# Patient Record
Sex: Female | Born: 1970 | Race: Black or African American | Hispanic: No | Marital: Single | Smoking: Never smoker
Health system: Southern US, Community
[De-identification: ages and names within clinical notes are randomized; demographics above are authoritative.]

## PROBLEM LIST (undated history)

## (undated) DIAGNOSIS — M1990 Unspecified osteoarthritis, unspecified site: Secondary | ICD-10-CM

## (undated) DIAGNOSIS — J45909 Unspecified asthma, uncomplicated: Secondary | ICD-10-CM

## (undated) HISTORY — PX: COLONOSCOPY: SHX174

---

## 2016-07-04 ENCOUNTER — Other Ambulatory Visit: Payer: Self-pay | Admitting: Orthopedic Surgery

## 2016-07-04 DIAGNOSIS — M25562 Pain in left knee: Secondary | ICD-10-CM

## 2016-07-17 ENCOUNTER — Ambulatory Visit
Admission: RE | Admit: 2016-07-17 | Discharge: 2016-07-17 | Disposition: A | Discharge status: Home / Self Care | Payer: No Typology Code available for payment source | Source: Ambulatory Visit | Attending: Orthopedic Surgery | Admitting: Orthopedic Surgery

## 2016-07-17 DIAGNOSIS — M25562 Pain in left knee: Secondary | ICD-10-CM

## 2017-05-12 ENCOUNTER — Ambulatory Visit (INDEPENDENT_AMBULATORY_CARE_PROVIDER_SITE_OTHER): Payer: 59 | Admitting: Orthopedic Surgery

## 2017-05-12 ENCOUNTER — Encounter (INDEPENDENT_AMBULATORY_CARE_PROVIDER_SITE_OTHER): Payer: Self-pay | Admitting: Orthopedic Surgery

## 2017-05-12 DIAGNOSIS — G8929 Other chronic pain: Secondary | ICD-10-CM | POA: Diagnosis not present

## 2017-05-12 DIAGNOSIS — M25562 Pain in left knee: Secondary | ICD-10-CM | POA: Diagnosis not present

## 2017-05-15 NOTE — Progress Notes (Signed)
   Office Visit Note   Patient: Paige Cameron           Date of Birth: 16-Jul-1971           MRN: [id number] Visit Date: 05/12/2017 Requested by: No referring provider defined for this encounter. PCP: No primary care provider on file.  Subjective: Chief Complaint  Patient presents with  . Left Knee - Pain    HPI: Paige Cameron is a 46 year old patient with known left knee medial meniscal tear.  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  She states the knee feels heavy sometimes.  No family history of DVT.  She is taking ibuprofen for the symptoms.  She describes popping and locking in waking from the pain.  She states the knee is getting worse.              ROS: All systems reviewed are negative as they relate to the chief complaint within the history of present illness.  Patient denies  fevers or chills.   Assessment & Plan: Visit Diagnoses:  1. Chronic pain of left knee     Plan: Impression left knee symptomatic medial meniscal tear.  She spell nonoperative measures.  Plan is arthroscopy with meniscal debridement.  Risks and benefits discussed with the patient could not limited to infection or vessel damage incomplete pain relief as well as stiffness.  Time out of work discussed.  All questions answered.  Follow-Up Instructions: No Follow-up on file.   Orders:  No orders of the defined types were placed in this encounter.  No orders of the defined types were placed in this encounter.     Procedures: No procedures performed   Clinical Data: No additional findings.  Objective: Vital Signs: There were no vitals taken for this visit.  Physical Exam:   Constitutional: Patient appears well-developed HEENT:  Head: Normocephalic Eyes:EOM are normal Neck: Normal range of motion Cardiovascular: Normal rate Pulmonary/chest: Effort normal Neurologic: Patient is alert Skin: Skin is warm Psychiatric: Patient has normal mood and affect    Ortho Exam:  Orthopedic exam demonstrates full active and passive range of motion of the left knee with medial joint line tenderness positive direct compression testing stable collateral crucial ligaments intact since mechanism no other masses lymph and after skin changes noted in the left knee region.  Range of motion is full.  Extensor mechanism is intact.  Specialty Comments:  No specialty comments available.  Imaging: No results found.   PMFS History: There are no active problems to display for this patient.  No past medical history on file.  No family history on file.  No past surgical history on file. Social History   Occupational History  . Not on file.   Social History Main Topics  . Smoking status: Never Smoker  . Smokeless tobacco: Never Used  . Alcohol use Not on file  . Drug use: Unknown  . Sexual activity: Not on file

## 2017-06-02 ENCOUNTER — Encounter (INDEPENDENT_AMBULATORY_CARE_PROVIDER_SITE_OTHER): Payer: Self-pay | Admitting: Orthopedic Surgery

## 2017-06-21 ENCOUNTER — Inpatient Hospital Stay (INDEPENDENT_AMBULATORY_CARE_PROVIDER_SITE_OTHER): Payer: 59 | Admitting: Orthopedic Surgery

## 2017-06-21 ENCOUNTER — Encounter (INDEPENDENT_AMBULATORY_CARE_PROVIDER_SITE_OTHER): Payer: Self-pay | Admitting: Orthopedic Surgery

## 2017-06-21 DIAGNOSIS — S83242D Other tear of medial meniscus, current injury, left knee, subsequent encounter: Secondary | ICD-10-CM | POA: Diagnosis not present

## 2017-06-28 ENCOUNTER — Ambulatory Visit (INDEPENDENT_AMBULATORY_CARE_PROVIDER_SITE_OTHER): Payer: 59 | Admitting: Orthopedic Surgery

## 2017-06-28 ENCOUNTER — Encounter (INDEPENDENT_AMBULATORY_CARE_PROVIDER_SITE_OTHER): Payer: Self-pay | Admitting: Orthopedic Surgery

## 2017-06-28 DIAGNOSIS — S838X2D Sprain of other specified parts of left knee, subsequent encounter: Secondary | ICD-10-CM

## 2017-06-28 NOTE — Progress Notes (Signed)
   Post-Op Visit Note   Patient: Alison StallingJosette Alcock           Date of Birth: 29-Jul-1971           MRN: [id number] Visit Date: 06/28/2017 PCP: Patient, No Pcp Per   Assessment & Plan:  Chief Complaint:  Chief Complaint  Patient presents with  . Left Knee - Routine Post Op   Visit Diagnoses: No diagnosis found.  Plan: [name] is a 46 year old patient who is now a week out knee arthroscopy for partial medial meniscal tear.  On exam she has mild effusions in his mechanism is intact she is About 70 of flexion.  Plan is for her to really continue to work on flexion as well as quad strengthening exercises.  Sr. a bike use and encouraged.  Need to see her back in 2 weeks to make sure she is making progress with flexion.  A consider aspiration of the knee at that time but that's an issue.  Also want her to take an anti-inflammatory to try to decrease the swelling in order to allow her to increase her motion.  Follow-Up Instructions: Return in about 2 weeks (around 07/12/2017).   Orders:  No orders of the defined types were placed in this encounter.  No orders of the defined types were placed in this encounter.   Imaging: No results found.  PMFS History: There are no active problems to display for this patient.  No past medical history on file.  No family history on file.  No past surgical history on file. Social History   Occupational History  . Not on file.   Social History Main Topics  . Smoking status: Never Smoker  . Smokeless tobacco: Never Used  . Alcohol use Not on file  . Drug use: Unknown  . Sexual activity: Not on file

## 2017-07-12 ENCOUNTER — Encounter (INDEPENDENT_AMBULATORY_CARE_PROVIDER_SITE_OTHER): Payer: Self-pay | Admitting: Orthopedic Surgery

## 2017-07-12 ENCOUNTER — Ambulatory Visit (INDEPENDENT_AMBULATORY_CARE_PROVIDER_SITE_OTHER): Payer: 59 | Admitting: Orthopedic Surgery

## 2017-07-12 DIAGNOSIS — S838X2D Sprain of other specified parts of left knee, subsequent encounter: Secondary | ICD-10-CM

## 2017-07-12 NOTE — Progress Notes (Signed)
   Post-Op Visit Note   Patient: Paige Cameron           Date of Birth: 1971-04-27           MRN: [id number] Visit Date: 07/12/2017 PCP: Patient, No Pcp Per   Assessment & Plan:  Chief Complaint:  Chief Complaint  Patient presents with  . Left Knee - Routine Post Op   Visit Diagnoses: No diagnosis found.  Plan: Patient is 3 weeks out left knee arthroscopy posterior medial meniscectomy.  Back today to make sure flexion was improving.  She has a mild effusion and flexion past 90.  Continue with nonweightbearing quad strengthening exercises.  Ibuprofen 1-2 a day would help to keep the swelling down.  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  Follow-Up Instructions: Return in about 3 weeks (around 08/02/2017).   Orders:  No orders of the defined types were placed in this encounter.  No orders of the defined types were placed in this encounter.   Imaging: No results found.  PMFS History: There are no active problems to display for this patient.  No past medical history on file.  No family history on file.  No past surgical history on file. Social History   Occupational History  . Not on file.   Social History Main Topics  . Smoking status: Never Smoker  . Smokeless tobacco: Never Used  . Alcohol use Not on file  . Drug use: Unknown  . Sexual activity: Not on file

## 2017-08-04 ENCOUNTER — Encounter (INDEPENDENT_AMBULATORY_CARE_PROVIDER_SITE_OTHER): Payer: Self-pay | Admitting: Orthopedic Surgery

## 2017-08-04 ENCOUNTER — Ambulatory Visit (INDEPENDENT_AMBULATORY_CARE_PROVIDER_SITE_OTHER): Payer: 59 | Admitting: Orthopedic Surgery

## 2017-08-04 DIAGNOSIS — S838X2D Sprain of other specified parts of left knee, subsequent encounter: Secondary | ICD-10-CM

## 2017-08-05 NOTE — Progress Notes (Signed)
   Post-Op Visit Note   Patient: Paige Cameron           Date of Birth: February 13, 1971           MRN: [id number] Visit Date: 08/04/2017 PCP: Patient, No Pcp Per   Assessment & Plan:  Chief Complaint:  Chief Complaint  Patient presents with  . Left Knee - Follow-up   Visit Diagnoses: No diagnosis found.  Plan: Paige Cameron is a patient is now 6 weeks out left knee posterior medial meniscectomy.  She's been doing well.'s range of motion has improved and is normalized.  On exam there is only trace effusion and no joint line tenderness.  Quad strength is improving.  Plan is for her to return to work.  She works in Office managersecurity.  There is not too much walking involved.  Nonweightbearing quad strengthening exercises encouraged.  Follow-up with me as needed  Follow-Up Instructions: Return if symptoms worsen or fail to improve.   Orders:  No orders of the defined types were placed in this encounter.  No orders of the defined types were placed in this encounter.   Imaging: No results found.  PMFS History: There are no active problems to display for this patient.  No past medical history on file.  No family history on file.  No past surgical history on file. Social History   Occupational History  . Not on file.   Social History Main Topics  . Smoking status: Never Smoker  . Smokeless tobacco: Never Used  . Alcohol use Not on file  . Drug use: Unknown  . Sexual activity: Not on file

## 2017-09-09 ENCOUNTER — Ambulatory Visit (INDEPENDENT_AMBULATORY_CARE_PROVIDER_SITE_OTHER): Payer: 59 | Admitting: Orthopedic Surgery

## 2017-09-09 ENCOUNTER — Encounter (INDEPENDENT_AMBULATORY_CARE_PROVIDER_SITE_OTHER): Payer: Self-pay | Admitting: Orthopedic Surgery

## 2017-09-09 DIAGNOSIS — S838X2D Sprain of other specified parts of left knee, subsequent encounter: Secondary | ICD-10-CM

## 2017-09-11 NOTE — Progress Notes (Signed)
   Post-Op Visit Note   Patient: Paige Cameron           Date of Birth: 1971-09-19           MRN: [id number] Visit Date: 09/09/2017 PCP: Patient, No Pcp Per   Assessment & Plan:  Chief Complaint:  Chief Complaint  Patient presents with  . Left Knee - Pain   Visit Diagnoses: No diagnosis found.  Plan: Paige Cameron is a 46 year old patient who is now almost 3 months out left knee arthroscopy.  The having some recurrent pain for the past 2 and half weeks.  She had partial medial meniscectomy.  On examination she does have an effusion.  That effusion is aspirated today and we injected 9 mL of Marcaine was cc Depo-Medrol.  Aspirated about 40 mL of yellow fluid.  Would have her continue taking ibuprofen.  6 week return for clinical recheck at that time  Follow-Up Instructions: Return if symptoms worsen or fail to improve.   Orders:  No orders of the defined types were placed in this encounter.  No orders of the defined types were placed in this encounter.   Imaging: No results found.  PMFS History: There are no active problems to display for this patient.  No past medical history on file.  No family history on file.  No past surgical history on file. Social History   Occupational History  . Not on file.   Social History Main Topics  . Smoking status: Never Smoker  . Smokeless tobacco: Never Used  . Alcohol use Not on file  . Drug use: Unknown  . Sexual activity: Not on file

## 2017-10-25 ENCOUNTER — Encounter (INDEPENDENT_AMBULATORY_CARE_PROVIDER_SITE_OTHER): Payer: Self-pay | Admitting: Orthopedic Surgery

## 2017-10-25 ENCOUNTER — Ambulatory Visit (INDEPENDENT_AMBULATORY_CARE_PROVIDER_SITE_OTHER): Payer: 59 | Admitting: Orthopedic Surgery

## 2017-10-25 DIAGNOSIS — S838X2D Sprain of other specified parts of left knee, subsequent encounter: Secondary | ICD-10-CM | POA: Diagnosis not present

## 2017-10-25 NOTE — Progress Notes (Signed)
   Office Visit Note   Patient: Paige Cameron           Date of Birth: 10/09/1971           MRN: [id number] Visit Date: 10/25/2017 Requested by: No referring provider defined for this encounter. PCP: Patient, No Pcp Per  Subjective: Chief Complaint  Patient presents with  . Left Knee - Follow-up    HPI: Visit is a 46 year old patient with left knee pain.  She had arthroscopy and partial meniscectomy about 4 months ago.  She's doing light duty at work.  Taking ibuprofen occasionally.  No mechanical symptoms but does get some swelling periodically.              ROS: All systems reviewed are negative as they relate to the chief complaint within the history of present illness.  Patient denies  fevers or chills.   Assessment & Plan: Visit Diagnoses:  1. Injury of meniscus of left knee, subsequent encounter     Plan: Impression is recurrent pain and mild swelling in the left knee.  I think this is related to her surgery and subsequent cushion deficiency in the knee.  Had a seasonal be okay for regular duty in 2 months.  I'll see her back in 4 months for clinical recheck.  Continue with anti-inflammatories as needed for pain  Follow-Up Instructions: Return in about 4 months (around 02/22/2018).   Orders:  No orders of the defined types were placed in this encounter.  No orders of the defined types were placed in this encounter.     Procedures: No procedures performed   Clinical Data: No additional findings.  Objective: Vital Signs: There were no vitals taken for this visit.  Physical Exam:   Constitutional: Patient appears well-developed HEENT:  Head: Normocephalic Eyes:EOM are normal Neck: Normal range of motion Cardiovascular: Normal rate Pulmonary/chest: Effort normal Neurologic: Patient is alert Skin: Skin is warm Psychiatric: Patient has normal mood and affect    Ortho Exam: Orthopedic exam demonstrates full range of motion of the left knee but with mild  effusion present.  No focal joint line tenderness is present.  Pedal pulses palpable no calf tenderness noted.  No groin pain with internal/external rotation of the leg.  No other masses lymph adenopathy or skin changes noted in the left knee region  Specialty Comments:  No specialty comments available.  Imaging: No results found.   PMFS History: There are no active problems to display for this patient.  History reviewed. No pertinent past medical history.  History reviewed. No pertinent family history.  History reviewed. No pertinent surgical history. Social History   Occupational History  . Not on file  Tobacco Use  . Smoking status: Never Smoker  . Smokeless tobacco: Never Used  Substance and Sexual Activity  . Alcohol use: Not on file  . Drug use: Not on file  . Sexual activity: Not on file

## 2018-02-23 ENCOUNTER — Ambulatory Visit (INDEPENDENT_AMBULATORY_CARE_PROVIDER_SITE_OTHER): Payer: 59 | Admitting: Orthopedic Surgery

## 2018-02-23 ENCOUNTER — Encounter (INDEPENDENT_AMBULATORY_CARE_PROVIDER_SITE_OTHER): Payer: Self-pay | Admitting: Orthopedic Surgery

## 2018-02-23 DIAGNOSIS — M25562 Pain in left knee: Secondary | ICD-10-CM | POA: Diagnosis not present

## 2018-02-27 ENCOUNTER — Encounter (INDEPENDENT_AMBULATORY_CARE_PROVIDER_SITE_OTHER): Payer: Self-pay | Admitting: Orthopedic Surgery

## 2018-02-27 NOTE — Progress Notes (Signed)
   Office Visit Note   Patient: Paige StallingJosette Rutan           Date of Birth: 04-Feb-1971           MRN: [id number] Visit Date: 02/23/2018 Requested by: No referring provider defined for this encounter. PCP: Patient, No Pcp Per  Subjective: Chief Complaint  Patient presents with  . Left Knee - Pain    HPI: Present is a patient with left knee pain.  She had left knee arthroscopy in July 2018.  Reports continued pain and swelling along with weakness and giving way.  Takes ibuprofen as needed.  She had partial lateral meniscectomy at that time.  She is having now medial sided pain in that left knee.  She does sedentary type work.              ROS: All systems reviewed are negative as they relate to the chief complaint within the history of present illness.  Patient denies  fevers or chills.   Assessment & Plan: Visit Diagnoses:  1. Left knee pain, unspecified chronicity     Plan: Impression is left knee pain.  Having some medial sided pain with recurrent effusion.  May or may not represent new pathology.  She has had multiple aspirations and injections.  She is failed conservative management.  Plan is for MRI scanning of that left knee with return office visit after that.  Follow-Up Instructions: Return for after MRI.   Orders:  Orders Placed This Encounter  Procedures  . MR Knee Left w/o contrast   No orders of the defined types were placed in this encounter.     Procedures: No procedures performed   Clinical Data: No additional findings.  Objective: Vital Signs: There were no vitals taken for this visit.  Physical Exam:   Constitutional: Patient appears well-developed HEENT:  Head: Normocephalic Eyes:EOM are normal Neck: Normal range of motion Cardiovascular: Normal rate Pulmonary/chest: Effort normal Neurologic: Patient is alert Skin: Skin is warm Psychiatric: Patient has normal mood and affect    Ortho Exam: Orthopedic exam demonstrates normal gait and  alignment.  Mild effusion in the left knee no effusion in the right knee.  Collateral cruciate ligaments are stable.  No masses lymphadenopathy or skin changes noted in that left knee region.  Mechanism is intact.  Both medial and lateral joint line tenderness is present  Specialty Comments:  No specialty comments available.  Imaging: No results found.   PMFS History: There are no active problems to display for this patient.  History reviewed. No pertinent past medical history.  History reviewed. No pertinent family history.  History reviewed. No pertinent surgical history. Social History   Occupational History  . Not on file  Tobacco Use  . Smoking status: Never Smoker  . Smokeless tobacco: Never Used  Substance and Sexual Activity  . Alcohol use: Not on file  . Drug use: Not on file  . Sexual activity: Not on file

## 2018-03-09 ENCOUNTER — Ambulatory Visit
Admission: RE | Admit: 2018-03-09 | Discharge: 2018-03-09 | Disposition: A | Discharge status: Home / Self Care | Payer: 59 | Source: Ambulatory Visit | Attending: Orthopedic Surgery | Admitting: Orthopedic Surgery

## 2018-03-09 DIAGNOSIS — M25562 Pain in left knee: Secondary | ICD-10-CM

## 2018-03-28 ENCOUNTER — Ambulatory Visit (INDEPENDENT_AMBULATORY_CARE_PROVIDER_SITE_OTHER): Payer: 59 | Admitting: Orthopedic Surgery

## 2018-04-07 ENCOUNTER — Ambulatory Visit (INDEPENDENT_AMBULATORY_CARE_PROVIDER_SITE_OTHER): Payer: 59 | Admitting: Orthopedic Surgery

## 2018-04-07 ENCOUNTER — Encounter (INDEPENDENT_AMBULATORY_CARE_PROVIDER_SITE_OTHER): Payer: Self-pay | Admitting: Orthopedic Surgery

## 2018-04-07 DIAGNOSIS — S838X2D Sprain of other specified parts of left knee, subsequent encounter: Secondary | ICD-10-CM

## 2018-04-09 ENCOUNTER — Encounter (INDEPENDENT_AMBULATORY_CARE_PROVIDER_SITE_OTHER): Payer: Self-pay | Admitting: Orthopedic Surgery

## 2018-04-09 DIAGNOSIS — S838X2D Sprain of other specified parts of left knee, subsequent encounter: Secondary | ICD-10-CM | POA: Diagnosis not present

## 2018-04-09 MED ORDER — LIDOCAINE HCL 1 % IJ SOLN
5.0000 mL | INTRAMUSCULAR | Status: AC | PRN
  Administered 2018-04-09: 5 mL

## 2018-04-09 MED ORDER — METHYLPREDNISOLONE ACETATE 40 MG/ML IJ SUSP
40.0000 mg | INTRAMUSCULAR | Status: AC | PRN
  Administered 2018-04-09: 40 mg via INTRA_ARTICULAR

## 2018-04-09 MED ORDER — BUPIVACAINE HCL 0.25 % IJ SOLN
4.0000 mL | INTRAMUSCULAR | Status: AC | PRN
  Administered 2018-04-09: 4 mL via INTRA_ARTICULAR

## 2018-04-09 NOTE — Progress Notes (Signed)
Office Visit Note   Patient: Paige Cameron           Date of Birth: 01-09-1971           MRN: [id number] Visit Date: 04/07/2018 Requested by: No referring provider defined for this encounter. PCP: Patient, No Pcp Per  Subjective: Chief Complaint  Patient presents with  . Left Knee - Follow-up    HPI: Shows that is a patient with left knee pain.  Since I have seen her she had an MRI scan.  That scan is reviewed with the patient today.  It does show interval increase in moderate arthritis in that medial compartment along with a recurrent left knee medial meniscal tear.  She is fairly symptomatic from this but does not really want to pursue any intervention at this time.  Discussed with her at length how operative intervention would likely give her short-term relief but the arthritis in that medial compartment will likely progress as it has been doing.  She is taking anti-inflammatories for the problem.  She may get an over-the-counter knee sleeve.              ROS: All systems reviewed are negative as they relate to the chief complaint within the history of present illness.  Patient denies  fevers or chills.   Assessment & Plan: Visit Diagnoses:  1. Injury of meniscus of left knee, subsequent encounter     Plan: Impression is left knee pain with recurrent meniscal tear and progressive arthritis in the medial compartment.  Aspiration injection of that knee is performed today.  79-month recheck to make the decision for or against arthroscopic intervention.  Again I think removing more that meniscus will likely help her short-term but her knee arthritis is likely to be progressive based on the load that knee is bearing along with absence of that cushioning effect of the meniscus  Follow-Up Instructions: Return in about 3 months (around 07/08/2018).   Orders:  No orders of the defined types were placed in this encounter.  No orders of the defined types were placed in this  encounter.     Procedures: Large Joint Inj: L knee on 04/09/2018 7:46 PM Indications: diagnostic evaluation, joint swelling and pain Details: 18 G 1.5 in needle, superolateral approach  Arthrogram: No  Medications: 5 mL lidocaine 1 %; 40 mg methylPREDNISolone acetate 40 MG/ML; 4 mL bupivacaine 0.25 % Aspirate: 15 mL yellow Outcome: tolerated well, no immediate complications Procedure, treatment alternatives, risks and benefits explained, specific risks discussed. Consent was given by the patient. Immediately prior to procedure a time out was called to verify the correct patient, procedure, equipment, support staff and site/side marked as required. Patient was prepped and draped in the usual sterile fashion.       Clinical Data: No additional findings.  Objective: Vital Signs: There were no vitals taken for this visit.  Physical Exam:   Constitutional: Patient appears well-developed HEENT:  Head: Normocephalic Eyes:EOM are normal Neck: Normal range of motion Cardiovascular: Normal rate Pulmonary/chest: Effort normal Neurologic: Patient is alert Skin: Skin is warm Psychiatric: Patient has normal mood and affect    Ortho Exam: Orthopedic exam demonstrates full active and passive range of motion of the right knee.  Left knee has mild effusion and medial joint line tenderness but good range of motion and intact extensor mechanism.  No other masses lymph adenopathy or skin changes noted in that left knee region.  Pedal pulses palpable.  Specialty Comments:  No specialty  comments available.  Imaging: No results found.   PMFS History: There are no active problems to display for this patient.  History reviewed. No pertinent past medical history.  History reviewed. No pertinent family history.  History reviewed. No pertinent surgical history. Social History   Occupational History  . Not on file  Tobacco Use  . Smoking status: Never Smoker  . Smokeless tobacco: Never  Used  Substance and Sexual Activity  . Alcohol use: Not on file  . Drug use: Not on file  . Sexual activity: Not on file

## 2018-07-11 ENCOUNTER — Ambulatory Visit (INDEPENDENT_AMBULATORY_CARE_PROVIDER_SITE_OTHER): Payer: 59 | Admitting: Orthopedic Surgery

## 2018-07-20 ENCOUNTER — Ambulatory Visit (INDEPENDENT_AMBULATORY_CARE_PROVIDER_SITE_OTHER): Payer: 59 | Admitting: Orthopedic Surgery

## 2018-11-15 IMAGING — MR MR KNEE*L* W/O CM
4 of 6 series · 18 of 40 positions shown · non-contrast
Comparison: MRI left knee 07/17/2016.

CLINICAL DATA: The patient underwent left knee surgery for a torn
meniscus 05/29/2017. Recurrent left knee pain on the medial side
with popping and weakness.

EXAM:
MRI OF THE LEFT KNEE WITHOUT CONTRAST
TECHNIQUE: Multiplanar, multisequence MR imaging of the knee was performed. No
intravenous contrast was administered.

[Series 3: PD fat-sat · axial · 4.0mm · 0.31mm/px · z∈[-49,+74]mm · 8 of 29 slices shown (1 of 4)]
[im 1/29]
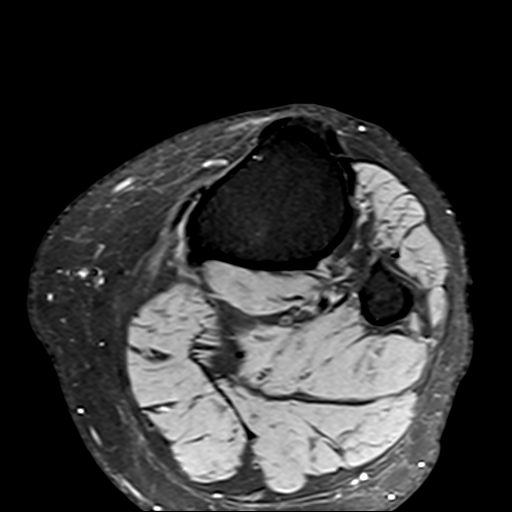
[im 5/29]
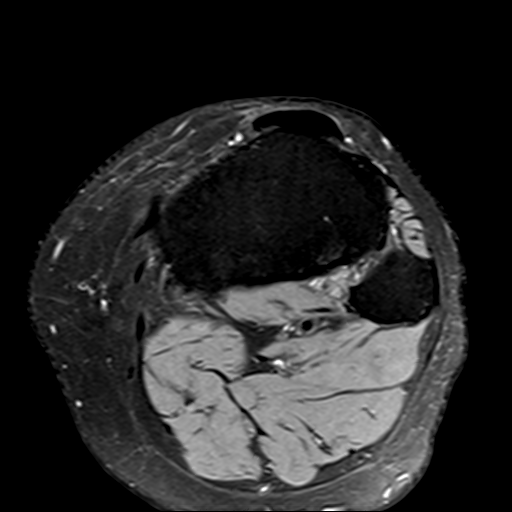
[im 9/29]
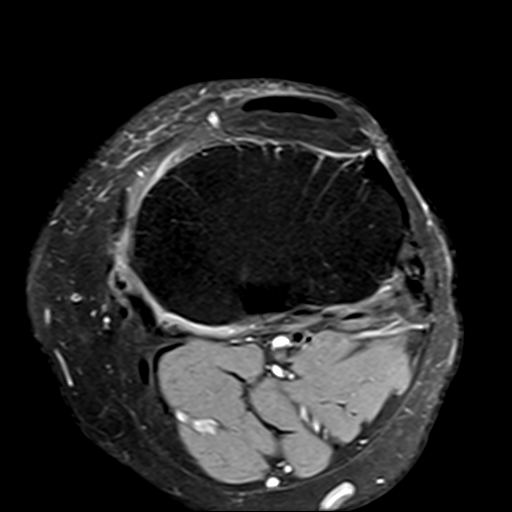
[im 13/29]
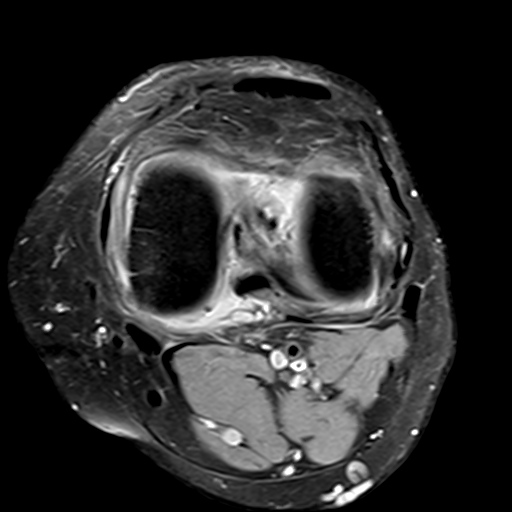
[im 17/29]
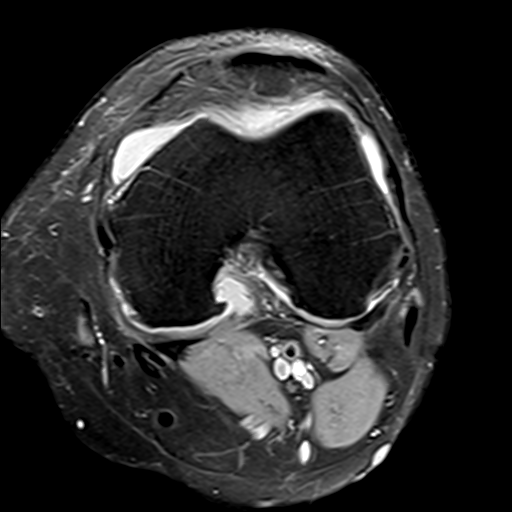
[im 21/29]
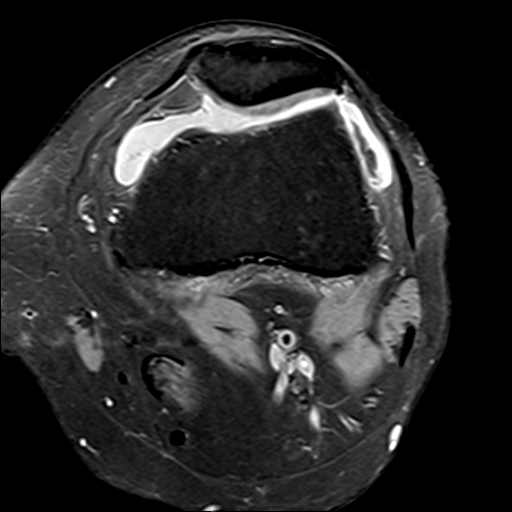
[im 25/29]
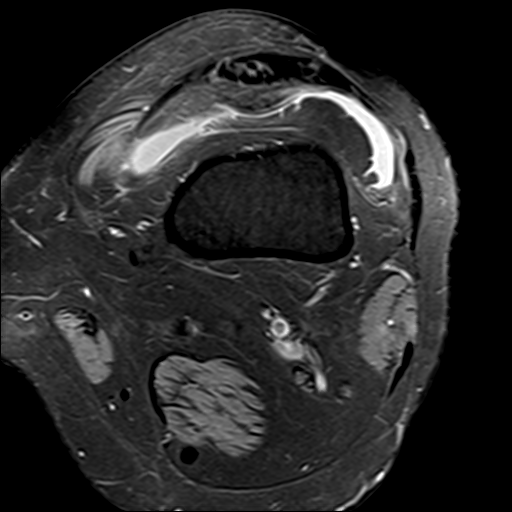
[im 29/29]
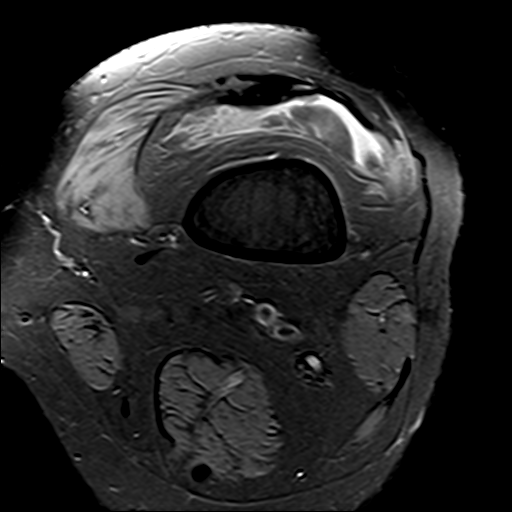

[Series 6: PD fat-sat · coronal · 3.5mm · 0.31mm/px · 4 of 30 slices shown (2 of 4)]
[im 1/30]
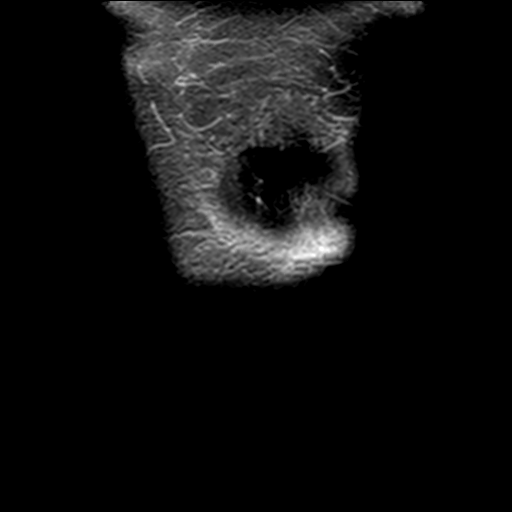
[im 5/30]
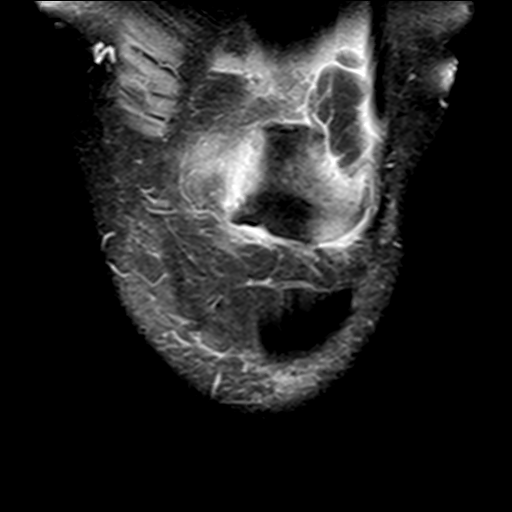
[im 15/30]
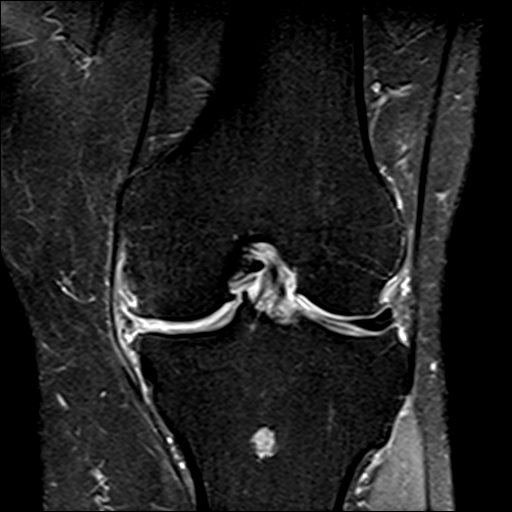
[im 25/30]
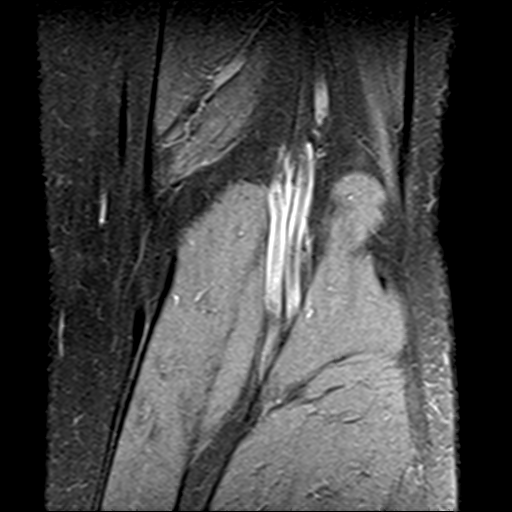

[Series 7: PD fat-sat · sagittal · 3.5mm · 0.31mm/px · 3 of 28 slices shown (3 of 4)]
[im 5/28]
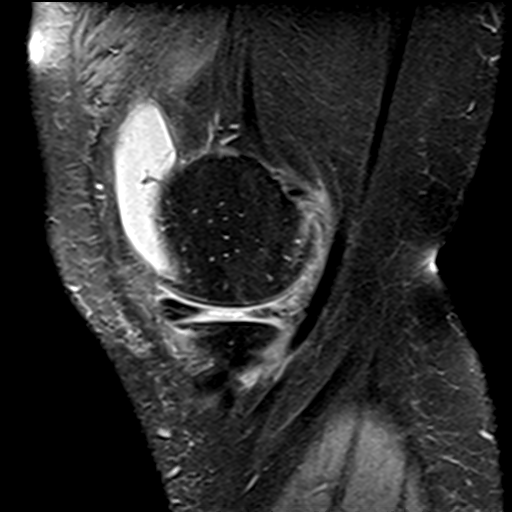
[im 14/28]
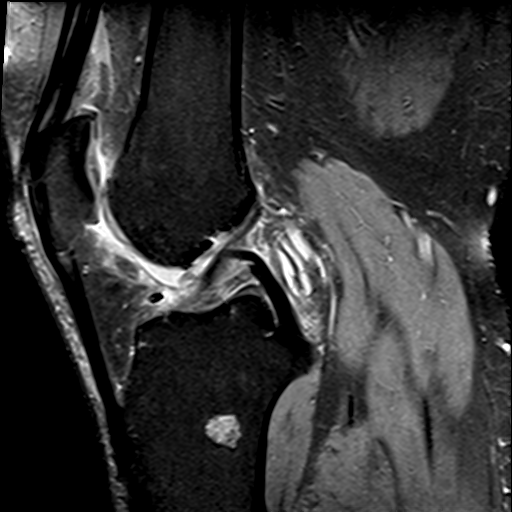
[im 23/28]
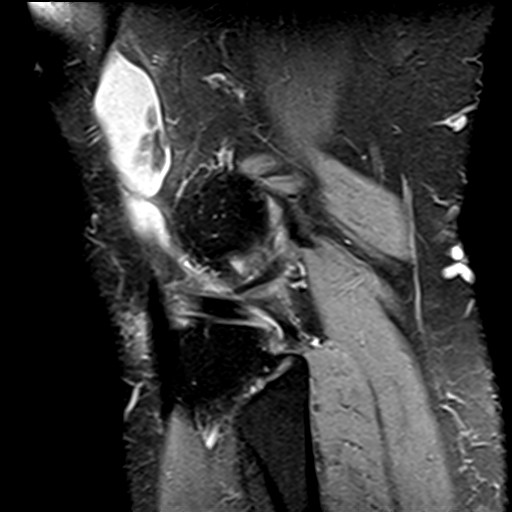

[Series 8: PD fat-sat · coronal · 2.0mm · 0.31mm/px · 3 of 14 slices shown (4 of 4)]
[im 1/14]
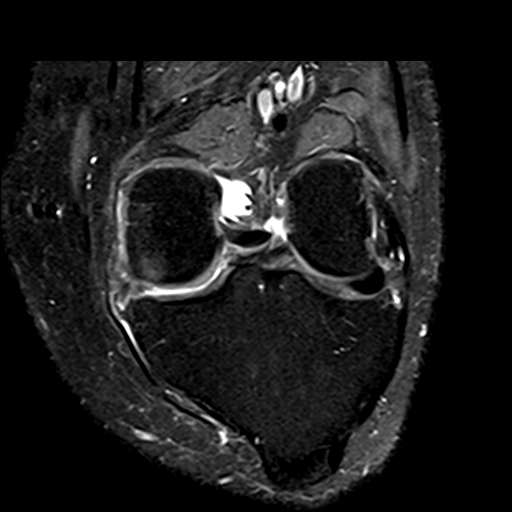
[im 7/14]
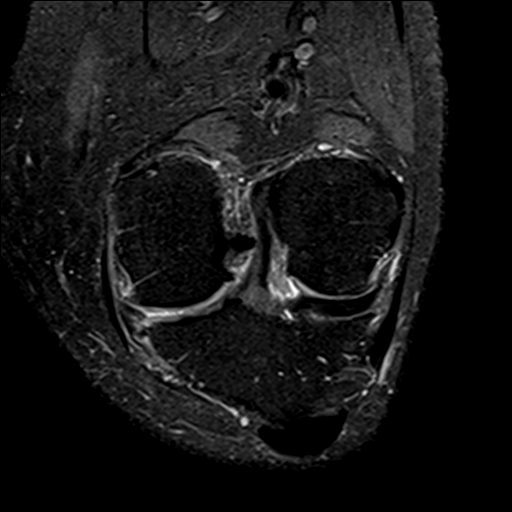
[im 14/14]
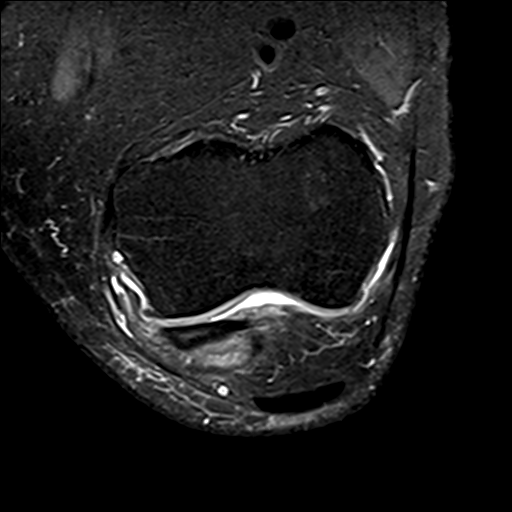

[18 of 40 positions shown; findings below may reference images not displayed]

FINDINGS: MENISCI

Medial meniscus: Since the prior examination, the patient has
undergone partial medial meniscectomy. Complex tearing is seen
throughout a degenerated posterior horn of the medial meniscus. The
body of the medial meniscus is diminutive consistent with prior
surgery. There is a horizontal tear reaching the meniscal
undersurface in the body..

Lateral meniscus: Partial discoid configuration is noted. There is
some degenerative signal but no tear.

LIGAMENTS

Cruciates:  Intact.

Collaterals:  Intact.

CARTILAGE

Patellofemoral: Cartilage thinning most notable at the patellar apex
and along the medial facet is not notably changed.

Medial: New cartilage thinning and irregularity are seen along both
the weight-bearing medial femoral condyle and medial tibial plateau.

Lateral:  Preserved.

Joint:  Small joint effusion.

Popliteal Fossa:  No Baker's cyst.

Extensor Mechanism:  Intact.

Bones: There is some osteophytosis about the knee which shows mild
progression medially. New small focus of subchondral edema in the
medial femoral condyle is identified. Tiny enchondroma proximal
tibia noted.

Other: None.
IMPRESSION: Status post debridement of a medial meniscal tear since the prior
MRI. There has been progressive degeneration of the posterior horn
and new complex tearing in the posterior horn of the medial
meniscus. Horizontal tear in the body reaches the meniscal
undersurface.

Osteoarthritis about the knee shows worsening in the medial
compartment since the prior MRI where it now appears moderate in
degree.

## 2022-07-07 ENCOUNTER — Ambulatory Visit (INDEPENDENT_AMBULATORY_CARE_PROVIDER_SITE_OTHER): Payer: Self-pay

## 2022-07-07 ENCOUNTER — Other Ambulatory Visit: Payer: Self-pay | Admitting: Physician Assistant

## 2022-07-07 DIAGNOSIS — M25561 Pain in right knee: Secondary | ICD-10-CM

## 2022-07-07 DIAGNOSIS — R52 Pain, unspecified: Secondary | ICD-10-CM

## 2022-07-07 DIAGNOSIS — M25562 Pain in left knee: Secondary | ICD-10-CM

## 2022-07-07 DIAGNOSIS — W19XXXA Unspecified fall, initial encounter: Secondary | ICD-10-CM

## 2023-02-12 ENCOUNTER — Other Ambulatory Visit: Payer: Self-pay | Admitting: Family Medicine

## 2023-02-12 DIAGNOSIS — S838X2A Sprain of other specified parts of left knee, initial encounter: Secondary | ICD-10-CM

## 2023-03-17 ENCOUNTER — Inpatient Hospital Stay: Admission: RE | Admit: 2023-03-17 | Payer: Self-pay | Source: Ambulatory Visit

## 2023-07-08 HISTORY — PX: KNEE ARTHROSCOPY W/ MENISCECTOMY: SHX1879

## 2024-10-31 ENCOUNTER — Other Ambulatory Visit: Payer: Self-pay | Admitting: Orthopaedic Surgery

## 2024-11-08 ENCOUNTER — Encounter (HOSPITAL_COMMUNITY): Payer: Self-pay

## 2024-11-13 NOTE — Patient Instructions (Signed)
 SURGICAL WAITING ROOM VISITATION Patients having surgery or a procedure may have no more than 2 support people in the waiting area - these visitors may rotate in the visitor waiting room.   Due to an increase in RSV and influenza rates and associated hospitalizations, children ages 48 and under may not visit patients in [facility] hospitals. If the patient needs to stay at the hospital during part of their recovery, the visitor guidelines for inpatient rooms apply.  PRE-OP VISITATION  Pre-op nurse will coordinate an appropriate time for 1 support person to accompany the patient in pre-op.  This support person may not rotate.  This visitor will be contacted when the time is appropriate for the visitor to come back in the pre-op area.  Please refer to the [facility] website for the visitor guidelines for Inpatients (after your surgery is over and you are in a regular room).  You are not required to quarantine at this time prior to your surgery. However, you must do this: Hand Hygiene often Do NOT share personal items Notify your provider if you are in close contact with someone who has COVID or you develop fever 100.4 or greater, new onset of sneezing, cough, sore throat, shortness of breath or body aches.  If you test positive for Covid or have been in contact with anyone that has tested positive in the last 10 days please notify you surgeon.    Your procedure is scheduled on:  11/21/24  Report to [facility] Main Entrance: [place] entrance where the [employer] is available.   Report to admitting at: 7:20 AM  Call this number if you have any questions or problems the morning of surgery [phone]  FOLLOW ANY ADDITIONAL PRE OP INSTRUCTIONS YOU RECEIVED FROM YOUR SURGEON'S OFFICE!!!  Do not eat food after Midnight the night prior to your surgery/procedure.  After Midnight you may have the following liquids until: 6:50 AM DAY OF SURGERY  Clear Liquid Diet Water Black  Coffee (sugar ok, NO MILK/CREAM OR CREAMERS)  Tea (sugar ok, NO MILK/CREAM OR CREAMERS) regular and decaf                             Plain Jell-O  with no fruit (NO RED)                                           Fruit ices (not with fruit pulp, NO RED)                                     Popsicles (NO RED)                                                                  Juice: NO CITRUS JUICES: only apple, WHITE grape, WHITE cranberry Sports drinks like Gatorade or Powerade (NO RED)  The day of surgery:  Drink ONE (1) Pre-Surgery Clear Ensure at : 6:50 AM the morning of surgery. Drink in one sitting. Do not sip.  This drink was given to  you during your hospital pre-op appointment visit. Nothing else to drink after completing the Pre-Surgery Clear Ensure or G2 : No candy, chewing gum or throat lozenges.    Oral Hygiene is also important to reduce your risk of infection.        Remember - BRUSH YOUR TEETH THE MORNING OF SURGERY WITH YOUR REGULAR TOOTHPASTE  Do NOT smoke after Midnight the night before surgery.  STOP TAKING all Vitamins, Herbs and supplements 1 week before your surgery.   Take ONLY these medicines the morning of surgery with A SIP OF WATER:None                    You may not have any metal on your body including hair pins, jewelry, and body piercing  Do not wear make-up, lotions, powders, perfumes / cologne, or deodorant  Do not wear nail polish including gel and S&S, artificial / acrylic nails, or any other type of covering on natural nails including finger and toenails. If you have artificial nails, gel coating, etc., that needs to be removed by a nail salon, Please have this removed prior to surgery. Not doing so may mean that your surgery could be cancelled or delayed if the Surgeon or anesthesia staff feels like they are unable to monitor you safely.   Do not shave 48 hours prior to surgery to avoid nicks in your skin which may contribute to postoperative infections.    Contacts, Hearing Aids, dentures or bridgework may not be worn into surgery. DENTURES WILL BE REMOVED PRIOR TO SURGERY PLEASE DO NOT APPLY Poly grip OR ADHESIVES!!!  You may bring a small overnight bag with you on the day of surgery, only pack items that are not valuable. [facility] IS NOT RESPONSIBLE   FOR VALUABLES THAT ARE LOST OR STOLEN.   Patients discharged on the day of surgery will not be allowed to drive home.  Someone NEEDS to stay with you for the first 24 hours after anesthesia.  Do not bring your home medications to the hospital. The Pharmacy will dispense medications listed on your medication list to you during your admission in the Hospital.  Special Instructions: Bring a copy of your healthcare power of attorney and living will documents the day of surgery, if you wish to have them scanned into your [place] [facility] Records- EPIC  Please read over the following fact sheets you were given: IF YOU HAVE QUESTIONS ABOUT YOUR PRE-OP INSTRUCTIONS, PLEASE CALL [phone]  PATIENT SIGNATURE_________________________________  NURSE SIGNATURE__________________________________  ________________________________________________________________________  Pre-operative 4 CHG Bath Instructions  DYNA-Hex 4 Chlorhexidine Gluconate 4% Solution Antiseptic 4 fl. oz   You can play a key role in reducing the risk of infection after surgery. Your skin needs to be as free of germs as possible. You can reduce the number of germs on your skin by washing with CHG (chlorhexidine gluconate) soap before surgery. CHG is an antiseptic soap that kills germs and continues to kill germs even after washing.   DO NOT use if you have an allergy to chlorhexidine/CHG or antibacterial soaps. If your skin becomes reddened or irritated, stop using the CHG and notify one of our RNs at   Please shower with the CHG soap starting 4 days before surgery using the following schedule:     Please keep in mind the  following:  DO NOT shave, including legs and underarms, starting the day of your first shower.   You may shave your face at any point before/day  of surgery.  Place clean sheets on your bed the day you start using CHG soap. Use a clean washcloth (not used since being washed) for each shower. DO NOT sleep with pets once you start using the CHG.  CHG Shower Instructions:  If you choose to wash your hair and private area, wash first with your normal shampoo/soap.  After you use shampoo/soap, rinse your hair and body thoroughly to remove shampoo/soap residue.  Turn the water OFF and apply about 3 tablespoons (45 ml) of CHG soap to a CLEAN washcloth.  Apply CHG soap ONLY FROM YOUR NECK DOWN TO YOUR TOES (washing for 3-5 minutes)  DO NOT use CHG soap on face, private areas, open wounds, or sores.  Pay special attention to the area where your surgery is being performed.  If you are having back surgery, having someone wash your back for you may be helpful. Wait 2 minutes after CHG soap is applied, then you may rinse off the CHG soap.  Pat dry with a clean towel  Put on clean clothes/pajamas   If you choose to wear lotion, please use ONLY the CHG-compatible lotions on the back of this paper.     Additional instructions for the day of surgery: DO NOT APPLY any lotions, deodorants, cologne, or perfumes.   Put on clean/comfortable clothes.  Brush your teeth.  Ask your nurse before applying any prescription medications to the skin.   CHG Compatible Lotions   Aveeno Moisturizing lotion  Cetaphil Moisturizing Cream  Cetaphil Moisturizing Lotion  Clairol Herbal Essence Moisturizing Lotion, Dry Skin  Clairol Herbal Essence Moisturizing Lotion, Extra Dry Skin  Clairol Herbal Essence Moisturizing Lotion, Normal Skin  Curel Age Defying Therapeutic Moisturizing Lotion with Alpha Hydroxy  Curel Extreme Care Body Lotion  Curel Soothing Hands Moisturizing Hand Lotion  Curel Therapeutic Moisturizing  Cream, Fragrance-Free  Curel Therapeutic Moisturizing Lotion, Fragrance-Free  Curel Therapeutic Moisturizing Lotion, Original Formula  Eucerin Daily Replenishing Lotion  Eucerin Dry Skin Therapy Plus Alpha Hydroxy Crme  Eucerin Dry Skin Therapy Plus Alpha Hydroxy Lotion  Eucerin Original Crme  Eucerin Original Lotion  Eucerin Plus Crme Eucerin Plus Lotion  Eucerin TriLipid Replenishing Lotion  Keri Anti-Bacterial Hand Lotion  Keri Deep Conditioning Original Lotion Dry Skin Formula Softly Scented  Keri Deep Conditioning Original Lotion, Fragrance Free Sensitive Skin Formula  Keri Lotion Fast Absorbing Fragrance Free Sensitive Skin Formula  Keri Lotion Fast Absorbing Softly Scented Dry Skin Formula  Keri Original Lotion  Keri Skin Renewal Lotion Keri Silky Smooth Lotion  Keri Silky Smooth Sensitive Skin Lotion  Nivea Body Creamy Conditioning Oil  Nivea Body Extra Enriched Lotion  Nivea Body Original Lotion  Nivea Body Sheer Moisturizing Lotion Nivea Crme  Nivea Skin Firming Lotion  NutraDerm 30 Skin Lotion  NutraDerm Skin Lotion  NutraDerm Therapeutic Skin Cream  NutraDerm Therapeutic Skin Lotion  ProShield Protective Hand Cream  Provon moisturizing lotion  Incentive Spirometer  An incentive spirometer is a tool that can help keep your lungs clear and active. This tool measures how well you are filling your lungs with each breath. Taking long deep breaths may help reverse or decrease the chance of developing breathing (pulmonary) problems (especially infection) following: A long period of time when you are unable to move or be active. BEFORE THE PROCEDURE  If the spirometer includes an indicator to show your best effort, your nurse or respiratory therapist will set it to a desired goal. If possible, sit up straight or lean slightly forward. Try  not to slouch. Hold the incentive spirometer in an upright position. INSTRUCTIONS FOR USE  Sit on the edge of your bed if possible,  or sit up as far as you can in bed or on a chair. Hold the incentive spirometer in an upright position. Breathe out normally. Place the mouthpiece in your mouth and seal your lips tightly around it. Breathe in slowly and as deeply as possible, raising the piston or the ball toward the top of the column. Hold your breath for 3-5 seconds or for as long as possible. Allow the piston or ball to fall to the bottom of the column. Remove the mouthpiece from your mouth and breathe out normally. Rest for a few seconds and repeat Steps 1 through 7 at least 10 times every 1-2 hours when you are awake. Take your time and take a few normal breaths between deep breaths. The spirometer may include an indicator to show your best effort. Use the indicator as a goal to work toward during each repetition. After each set of 10 deep breaths, practice coughing to be sure your lungs are clear. If you have an incision (the cut made at the time of surgery), support your incision when coughing by placing a pillow or rolled up towels firmly against it. Once you are able to get out of bed, walk around indoors and cough well. You may stop using the incentive spirometer when instructed by your caregiver.  RISKS AND COMPLICATIONS Take your time so you do not get dizzy or light-headed. If you are in pain, you may need to take or ask for pain medication before doing incentive spirometry. It is harder to take a deep breath if you are having pain. AFTER USE Rest and breathe slowly and easily. It can be helpful to keep track of a log of your progress. Your caregiver can provide you with a simple table to help with this. If you are using the spirometer at home, follow these instructions: SEEK MEDICAL CARE IF:  You are having difficultly using the spirometer. You have trouble using the spirometer as often as instructed. Your pain medication is not giving enough relief while using the spirometer. You develop fever of 100.5 F (38.1  C) or higher. SEEK IMMEDIATE MEDICAL CARE IF:  You cough up bloody sputum that had not been present before. You develop fever of 102 F (38.9 C) or greater. You develop worsening pain at or near the incision site. MAKE SURE YOU:  Understand these instructions. Will watch your condition. Will get help right away if you are not doing well or get worse. Document Released: 04/05/2007 Document Revised: 02/15/2012 Document Reviewed: 06/06/2007 [facility] Patient Information 2014 [place], [place].   ________________________________________________________________________

## 2024-11-14 ENCOUNTER — Encounter (HOSPITAL_COMMUNITY)
Admission: RE | Admit: 2024-11-14 | Discharge: 2024-11-14 | Disposition: A | Discharge status: Home / Self Care | Source: Ambulatory Visit | Attending: Orthopaedic Surgery | Admitting: Orthopaedic Surgery

## 2024-11-14 ENCOUNTER — Other Ambulatory Visit: Payer: Self-pay

## 2024-11-14 ENCOUNTER — Encounter (HOSPITAL_COMMUNITY): Payer: Self-pay

## 2024-11-14 VITALS — BP 133/92 | HR 71 | Temp 98.3°F | Ht 72.0 in | Wt 250.0 lb

## 2024-11-14 DIAGNOSIS — Z01818 Encounter for other preprocedural examination: Secondary | ICD-10-CM

## 2024-11-14 HISTORY — DX: Unspecified asthma, uncomplicated: J45.909

## 2024-11-14 HISTORY — DX: Unspecified osteoarthritis, unspecified site: M19.90

## 2024-11-14 LAB — SURGICAL PCR SCREEN
MRSA, PCR: NEGATIVE
Staphylococcus aureus: NEGATIVE

## 2024-11-14 LAB — CBC
HCT: 40.8 % (ref 36.0–46.0)
Hemoglobin: 13.1 g/dL (ref 12.0–15.0)
MCH: 30.4 pg (ref 26.0–34.0)
MCHC: 32.1 g/dL (ref 30.0–36.0)
MCV: 94.7 fL (ref 80.0–100.0)
Platelets: 280 K/uL (ref 150–400)
RBC: 4.31 MIL/uL (ref 3.87–5.11)
RDW: 12.7 % (ref 11.5–15.5)
WBC: 4.5 K/uL (ref 4.0–10.5)
nRBC: 0 % (ref 0.0–0.2)

## 2024-11-14 LAB — BASIC METABOLIC PANEL WITH GFR
Anion gap: 9 (ref 5–15)
BUN: 12 mg/dL (ref 6–20)
CO2: 27 mmol/L (ref 22–32)
Calcium: 9.2 mg/dL (ref 8.9–10.3)
Chloride: 106 mmol/L (ref 98–111)
Creatinine, Ser: 0.87 mg/dL (ref 0.44–1.00)
GFR, Estimated: >60 mL/min (ref >60)
Glucose, Bld: 87 mg/dL (ref 70–99)
Potassium: 3.8 mmol/L (ref 3.5–5.1)
Sodium: 141 mmol/L (ref 135–145)

## 2024-11-14 NOTE — Progress Notes (Addendum)
 For Anesthesia: PCP - Orie Barefoot : NP Cardiologist - NONE. Paige Jenkins [place], NP  : Clearance: 10/23/24  Bowel Prep reminder:  Chest x-ray - 07/15/24: [facility] EKG - 07/15/24: [facility]: Requested Stress Test - 08/30/24 ECHO -  Cardiac Cath -  Pacemaker/ICD device last checked: Pacemaker orders received: Device Rep notified:  Spinal Cord Stimulator:N/A  Sleep Study - N/A CPAP -   Fasting Blood Sugar - N/A Checks Blood Sugar _____ times a day Date and result of last Hgb A1c-  Last dose of GLP1 agonist- N/A GLP1 instructions: Hold 7 days prior to schedule (Hold 24 hours-daily)   Last dose of SGLT-2 inhibitors- N/A SGLT-2 instructions: Hold 72 hours prior to surgery  Blood Thinner Instructions: Last Dose: Time last taken:  Aspirin Instructions:N/A Last Dose: Time last taken:  Activity level: Can go up a flight of stairs and activities of daily living without stopping and without chest pain and/or shortness of breath   Able to exercise without chest pain and/or shortness of breath  Anesthesia review: Hx: Chest pain,+ COVID: 10/26/24  Patient denies shortness of breath, fever, cough and chest pain at PAT appointment   Patient verbalized understanding of instructions that were reviewed over the telephone.

## 2024-11-15 NOTE — Anesthesia Preprocedure Evaluation (Addendum)
 Anesthesia Evaluation  Patient identified by MRN, date of birth, ID band Patient awake    Reviewed: Allergy & Precautions, NPO status , Patient's Chart, lab work & pertinent test results  History of Anesthesia Complications Negative for: history of anesthetic complications  Airway Mallampati: III  TM Distance: >3 FB Neck ROM: Full    Dental no notable dental hx. (+) Teeth Intact   Pulmonary neg sleep apnea, neg COPD, Recent URI , Resolved, Patient abstained from smoking.Not current smoker   Pulmonary exam normal breath sounds clear to auscultation       Cardiovascular Exercise Tolerance: Good METS(-) hypertension(-) CAD and (-) Past MI negative cardio ROS (-) dysrhythmias  Rhythm:Regular Rate:Normal - Systolic murmurs    Neuro/Psych negative neurological ROS  negative psych ROS   GI/Hepatic ,neg GERD  ,,(+)     (-) substance abuse    Endo/Other  neg diabetes    Renal/GU negative Renal ROS     Musculoskeletal  (+) Arthritis ,    Abdominal  (+) + obese  Peds  Hematology Lab Results      Component                Value               Date                      WBC                      4.5                 11/14/2024                HGB                      13.1                11/14/2024                HCT                      40.8                11/14/2024                MCV                      94.7                11/14/2024                PLT                      280                 11/14/2024              Anesthesia Other Findings Patient seen in the ED on 07/15/24 at [facility] for chest pain. She had a negative w/u and was discharge with recommendations to f/u with Cardiology.   Seen by Cardiology at [facility] on 07/20/24. Exercise stress test was ordered and was low risk. She was seen in f/u on 10/23/24. It was felt her prior symptoms were due to GERD. Cleared for surgery:   Patient is well without any cardiovascular  symptoms or issues concerning for heart disease.  Had a exercise stress  test achieved 9.5 METS, had no acute EKG changes and no reported chest discomfort during testing.  Study was deemed a low risk study.  Patient may proceed to surgery as she has a low risk for having a cardiovascular event.   Seen by PCP on 11/24 and tested positive for COVID. At PAT visit she denies symptoms. Anticipate she can proceed.   Reproductive/Obstetrics                              Anesthesia Physical Anesthesia Plan  ASA: 2  Anesthesia Plan: Spinal   Post-op Pain Management: Regional block* and Ofirmev  IV (intra-op)*   Induction: Intravenous  PONV Risk Score and Plan: 2 and Ondansetron , Dexamethasone , Propofol  infusion, TIVA and Midazolam   Airway Management Planned: Natural Airway  Additional Equipment: None  Intra-op Plan:   Post-operative Plan:   Informed Consent: I have reviewed the patients History and Physical, chart, labs and discussed the procedure including the risks, benefits and alternatives for the proposed anesthesia with the patient or authorized representative who has indicated his/her understanding and acceptance.       Plan Discussed with: CRNA and Surgeon  Anesthesia Plan Comments: (Discussed R/B/A of neuraxial anesthesia technique with patient: - rare risks of spinal/epidural hematoma, nerve damage, infection - Risk of PDPH - Risk of nausea and vomiting - Risk of conversion to general anesthesia and its associated risks, including sore throat, damage to lips/eyes/teeth/oropharynx, and rare risks such as cardiac and respiratory events. - Risk of allergic reactions  Discussed r/b/a of adductor canal nerve block, including:  - bleeding, infection, nerve damage - poor or non functioning block. - reactions and toxicity to local anesthetic Patient understands.   Discussed the role of CRNA in patient's perioperative care.  Patient voiced  understanding.)         Anesthesia Quick Evaluation

## 2024-11-15 NOTE — Progress Notes (Signed)
 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  Patient seen in the ED on 07/15/24 at [facility] for chest pain. She had a negative w/u and was discharge with recommendations to f/u with Cardiology.  Seen by Cardiology at [facility] on 07/20/24. Exercise stress test was ordered and was low risk. She was seen in f/u on 10/23/24. It was felt her prior symptoms were due to GERD. Cleared for surgery:  Patient is well without any cardiovascular symptoms or issues concerning for heart disease.  Had a exercise stress test achieved 9.5 METS, had no acute EKG changes and no reported chest discomfort during testing.  Study was deemed a low risk study.  Patient may proceed to surgery as she has a low risk for having a cardiovascular event.  Seen by PCP on 11/24 and tested positive for COVID. At PAT visit she denies symptoms. Anticipate she can proceed.  VS: BP (!) 133/92   Pulse 71   Temp 36.8 C (Oral)   Ht 6' (1.829 m)   Wt 113.4 kg   LMP 10/25/2024 (Approximate)   SpO2 99%   BMI 33.91 kg/m   PROVIDERS: Patient, No Pcp Per   LABS: Labs reviewed: Acceptable for surgery. (all labs ordered are listed, but only abnormal results are displayed)  Labs Reviewed  SURGICAL PCR SCREEN  BASIC METABOLIC PANEL WITH GFR  CBC     IMAGES:   EKG 07/15/24 ([facility]):  Diagnosis sinus bradycardia at 57 bpm Normal axis Nonspecific intraventricular conduction delay 1st degree AV block at PR 314 Nonspecific ST and T wave abnormality  Exercise ST 08/30/24 ([facility]):  INDICATION: A 53 year old presenting for ETT.  Stress test ordered for precordial pain.  DESCRIPTION OF PROCEDURE: Informed consent was obtained.  The patient's  questions were answered.  The patient was brought to the stress lab in stable condition.  The patient's resting heart rate was 67 beats per minute with a blood pressure of 108/68.  The patient's electrocardiogram at baseline shows sinus rhythm, normal ecg.    The patient was able to exercise to 9.5 METs.  There was no chest pain reported.  Her blood pressure increased to 178/65 with a heart rate that increased to 155 beats per minute, which is 93% her maximal predicted heart rate.  Continuous electrocardiographic monitoring shows no ECG changes to suggest ischemia.  [facility] Treadmill Score: +6  CONCLUSION: 1.  No ECG changes to suggest ischemia during Exercise treadmill stress test 2.  Exercise capacity 9.5 METs. 3.  Low-risk ETT. Past Medical History:  Diagnosis Date   Arthritis    Asthma     Past Surgical History:  Procedure Laterality Date   COLONOSCOPY     KNEE ARTHROSCOPY W/ MENISCECTOMY Left 07/2023    MEDICATIONS:  Multiple Vitamins-Minerals (HAIR SKIN NAILS PO)   No current facility-administered medications for this encounter.    Burnard CHRISTELLA Odis DEVONNA [facility] Surgical Short Stay/Anesthesiology [facility] Phone [phone] 11/15/2024 10:06 AM

## 2024-11-20 NOTE — H&P (Signed)
 TOTAL KNEE ADMISSION H&P  Patient is being admitted for left total knee arthroplasty.  Subjective:  Chief Complaint:left knee pain.  HPI: Paige Cameron, 53 y.o. female, has a history of pain and functional disability in the left knee due to arthritis and has failed non-surgical conservative treatments for greater than 12 weeks to includeNSAID's and/or analgesics, corticosteriod injections, viscosupplementation injections, flexibility and strengthening excercises, supervised PT with diminished ADL's post treatment, use of assistive devices, weight reduction as appropriate, and activity modification.  Onset of symptoms was gradual, starting 5 years ago with gradually worsening course since that time. The patient noted prior procedures on the knee to include  arthroscopy on the left knee(s).  Patient currently rates pain in the left knee(s) at 10 out of 10 with activity. Patient has night pain, worsening of pain with activity and weight bearing, pain that interferes with activities of daily living, pain with passive range of motion, and joint swelling.  Patient has evidence of subchondral cysts, subchondral sclerosis, periarticular osteophytes, and joint space narrowing by imaging studies. There is no active infection.  There are no active problems to display for this patient.  Past Medical History:  Diagnosis Date   Arthritis    Asthma     Past Surgical History:  Procedure Laterality Date   COLONOSCOPY     KNEE ARTHROSCOPY W/ MENISCECTOMY Left 07/2023    No current facility-administered medications for this encounter.   Current Outpatient Medications  Medication Sig Dispense Refill Last Dose/Taking   Multiple Vitamins-Minerals (HAIR SKIN NAILS PO) Take 1 tablet by mouth daily.      Allergies[1]  Social History   Tobacco Use   Smoking status: Never   Smokeless tobacco: Never  Substance Use Topics   Alcohol use: Never    No family history on file.   Review of Systems   Musculoskeletal:  Positive for arthralgias.       Left knee  All other systems reviewed and are negative.   Objective:  Physical Exam Constitutional:      Appearance: Normal appearance.  HENT:     Head: Normocephalic and atraumatic.     Nose: Nose normal.     Mouth/Throat:     Pharynx: Oropharynx is clear.  Eyes:     Extraocular Movements: Extraocular movements intact.  Pulmonary:     Effort: Pulmonary effort is normal.  Abdominal:     Palpations: Abdomen is soft.  Musculoskeletal:     Cervical back: Normal range of motion.     Comments: Examination of bilateral knees show range of motion from 0-120 of flexion.  1+ crepitation bilaterally.  No effusion bilaterally.  She has tenderness palpation along her joint lines.  Ligaments are stable.  Both calves are soft and nontender.  She is neurovascularly intact distally bilaterally.   Skin:    General: Skin is warm and dry.  Neurological:     General: No focal deficit present.     Mental Status: She is alert and oriented to person, place, and time. Mental status is at baseline.  Psychiatric:        Mood and Affect: Mood normal.        Behavior: Behavior normal.        Thought Content: Thought content normal.        Judgment: Judgment normal.     Vital signs in last 24 hours:    Labs:   Estimated body mass index is 33.91 kg/m as calculated from the following:   Height  as of 11/14/24: 6' (1.829 m).   Weight as of 11/14/24: 113.4 kg.   Imaging Review Plain radiographs demonstrate severe degenerative joint disease of the left knee(s). The overall alignment isneutral. The bone quality appears to be good for age and reported activity level.      Assessment/Plan:  End stage primary arthritis, left knee   The patient history, physical examination, clinical judgment of the provider and imaging studies are consistent with end stage degenerative joint disease of the left knee(s) and total knee arthroplasty is deemed  medically necessary. The treatment options including medical management, injection therapy arthroscopy and arthroplasty were discussed at length. The risks and benefits of total knee arthroplasty were presented and reviewed. The risks due to aseptic loosening, infection, stiffness, patella tracking problems, thromboembolic complications and other imponderables were discussed. The patient acknowledged the explanation, agreed to proceed with the plan and consent was signed. Patient is being admitted for inpatient treatment for surgery, pain control, PT, OT, prophylactic antibiotics, VTE prophylaxis, progressive ambulation and ADL's and discharge planning. The patient is planning to be discharged home with home health services  Patient's anticipated LOS is less than 2 midnights, meeting these requirements: - Younger than 23 - Lives within 1 hour of care - Has a competent adult at home to recover with post-op recover - NO history of  - Chronic pain requiring opiods  - Diabetes  - Coronary Artery Disease  - Heart failure  - Heart attack  - Stroke  - DVT/VTE  - Cardiac arrhythmia  - Respiratory Failure/COPD  - Renal failure  - Anemia  - Advanced Liver disease      [1] No Known Allergies

## 2024-11-21 ENCOUNTER — Encounter (HOSPITAL_COMMUNITY): Admission: RE | Disposition: A | Payer: Self-pay | Source: Ambulatory Visit | Attending: Orthopaedic Surgery

## 2024-11-21 ENCOUNTER — Ambulatory Visit (HOSPITAL_COMMUNITY): Payer: Self-pay | Admitting: Medical

## 2024-11-21 ENCOUNTER — Other Ambulatory Visit: Payer: Self-pay

## 2024-11-21 ENCOUNTER — Encounter (HOSPITAL_COMMUNITY): Payer: Self-pay | Admitting: Orthopaedic Surgery

## 2024-11-21 ENCOUNTER — Ambulatory Visit (HOSPITAL_BASED_OUTPATIENT_CLINIC_OR_DEPARTMENT_OTHER): Payer: Self-pay | Admitting: Anesthesiology

## 2024-11-21 ENCOUNTER — Observation Stay (HOSPITAL_COMMUNITY)
Admission: RE | Admit: 2024-11-21 | Discharge: 2024-11-24 | Disposition: A | Payer: Self-pay | Attending: Orthopaedic Surgery | Admitting: Orthopaedic Surgery

## 2024-11-21 DIAGNOSIS — J45909 Unspecified asthma, uncomplicated: Secondary | ICD-10-CM | POA: Insufficient documentation

## 2024-11-21 DIAGNOSIS — M1712 Unilateral primary osteoarthritis, left knee: Principal | ICD-10-CM | POA: Insufficient documentation

## 2024-11-21 DIAGNOSIS — Z01818 Encounter for other preprocedural examination: Secondary | ICD-10-CM

## 2024-11-21 HISTORY — PX: TOTAL KNEE ARTHROPLASTY: SHX125

## 2024-11-21 LAB — POCT PREGNANCY, URINE: Preg Test, Ur: NEGATIVE

## 2024-11-21 SURGERY — ARTHROPLASTY, KNEE, TOTAL
Anesthesia: Spinal | Site: Knee | Laterality: Left

## 2024-11-21 MED ORDER — TRANEXAMIC ACID 1000 MG/10ML IV SOLN
INTRAVENOUS | Status: DC | PRN
  Administered 2024-11-21: 16:00:00 2000 mg via TOPICAL

## 2024-11-21 MED ORDER — PROPOFOL 10 MG/ML IV BOLUS
INTRAVENOUS | Status: AC
  Filled 2024-11-21: qty 20

## 2024-11-21 MED ORDER — ACETAMINOPHEN 325 MG PO TABS
325.0000 mg | ORAL_TABLET | Freq: Four times a day (QID) | ORAL | Status: DC | PRN
  Administered 2024-11-23 – 2024-11-24 (×3): 650 mg via ORAL
  Filled 2024-11-21 (×3): qty 2

## 2024-11-21 MED ORDER — METOCLOPRAMIDE HCL 5 MG/ML IJ SOLN: 5.0000 mg | Freq: Three times a day (TID) | INTRAMUSCULAR | Status: DC | PRN

## 2024-11-21 MED ORDER — DOCUSATE SODIUM 100 MG PO CAPS
100.0000 mg | ORAL_CAPSULE | Freq: Two times a day (BID) | ORAL | Status: DC
  Administered 2024-11-21 – 2024-11-24 (×5): 100 mg via ORAL
  Filled 2024-11-21 (×6): qty 1

## 2024-11-21 MED ORDER — MIDAZOLAM HCL (PF) 2 MG/2ML IJ SOLN
1.0000 mg | INTRAMUSCULAR | Status: DC
  Filled 2024-11-21: qty 2

## 2024-11-21 MED ORDER — METHOCARBAMOL 1000 MG/10ML IJ SOLN: 500.0000 mg | Freq: Four times a day (QID) | INTRAMUSCULAR | Status: DC | PRN

## 2024-11-21 MED ORDER — FENTANYL CITRATE (PF) 50 MCG/ML IJ SOSY: 50.0000 ug | PREFILLED_SYRINGE | INTRAMUSCULAR | Status: DC

## 2024-11-21 MED ORDER — METOCLOPRAMIDE HCL 5 MG PO TABS: 5.0000 mg | ORAL_TABLET | Freq: Three times a day (TID) | ORAL | Status: DC | PRN

## 2024-11-21 MED ORDER — ONDANSETRON HCL 4 MG/2ML IJ SOLN
INTRAMUSCULAR | Status: AC
  Filled 2024-11-21: qty 2

## 2024-11-21 MED ORDER — OXYCODONE HCL 5 MG/5ML PO SOLN: 5.0000 mg | Freq: Once | ORAL | Status: DC | PRN

## 2024-11-21 MED ORDER — MIDAZOLAM HCL (PF) 2 MG/2ML IJ SOLN
2.0000 mg | INTRAMUSCULAR | Status: DC
  Administered 2024-11-21: 14:00:00 2 mg via INTRAVENOUS

## 2024-11-21 MED ORDER — BUPIVACAINE-EPINEPHRINE (PF) 0.5% -1:200000 IJ SOLN
INTRAMUSCULAR | Status: AC
  Filled 2024-11-21: qty 1.8

## 2024-11-21 MED ORDER — BUPIVACAINE-EPINEPHRINE (PF) 0.5% -1:200000 IJ SOLN
INTRAMUSCULAR | Status: AC
  Filled 2024-11-21: qty 30

## 2024-11-21 MED ORDER — BISACODYL 5 MG PO TBEC: 5.0000 mg | DELAYED_RELEASE_TABLET | Freq: Every day | ORAL | Status: DC | PRN

## 2024-11-21 MED ORDER — TRANEXAMIC ACID 1000 MG/10ML IV SOLN
2000.0000 mg | INTRAVENOUS | Status: DC
  Filled 2024-11-21: qty 20

## 2024-11-21 MED ORDER — KETOROLAC TROMETHAMINE 15 MG/ML IJ SOLN
15.0000 mg | Freq: Four times a day (QID) | INTRAMUSCULAR | Status: AC
  Administered 2024-11-21 – 2024-11-22 (×4): 15 mg via INTRAVENOUS
  Filled 2024-11-21 (×4): qty 1

## 2024-11-21 MED ORDER — POVIDONE-IODINE 10 % EX SWAB: 2.0000 | Freq: Once | CUTANEOUS | Status: DC

## 2024-11-21 MED ORDER — SODIUM CHLORIDE (PF) 0.9 % IJ SOLN
INTRAMUSCULAR | Status: AC
  Filled 2024-11-21: qty 30

## 2024-11-21 MED ORDER — STERILE WATER FOR IRRIGATION IR SOLN
Status: DC | PRN
  Administered 2024-11-21: 15:00:00 2000 mL

## 2024-11-21 MED ORDER — SODIUM CHLORIDE (PF) 0.9 % IJ SOLN
INTRAMUSCULAR | Status: DC | PRN
  Administered 2024-11-21: 16:00:00 80 mL

## 2024-11-21 MED ORDER — MORPHINE SULFATE (PF) 2 MG/ML IV SOLN: 0.5000 mg | INTRAVENOUS | Status: DC | PRN

## 2024-11-21 MED ORDER — TRANEXAMIC ACID-NACL 1000-0.7 MG/100ML-% IV SOLN
1000.0000 mg | Freq: Once | INTRAVENOUS | Status: AC
  Administered 2024-11-21: 20:00:00 1000 mg via INTRAVENOUS
  Filled 2024-11-21: qty 100

## 2024-11-21 MED ORDER — ONDANSETRON HCL 4 MG PO TABS
4.0000 mg | ORAL_TABLET | Freq: Four times a day (QID) | ORAL | Status: DC | PRN
  Administered 2024-11-23 – 2024-11-24 (×2): 4 mg via ORAL
  Filled 2024-11-21 (×2): qty 1

## 2024-11-21 MED ORDER — FENTANYL CITRATE (PF) 100 MCG/2ML IJ SOLN
INTRAMUSCULAR | Status: DC | PRN
  Administered 2024-11-21 (×3): 25 ug via INTRAVENOUS

## 2024-11-21 MED ORDER — ONDANSETRON HCL 4 MG/2ML IJ SOLN
4.0000 mg | Freq: Four times a day (QID) | INTRAMUSCULAR | Status: DC | PRN
  Administered 2024-11-23 (×2): 4 mg via INTRAVENOUS
  Filled 2024-11-21 (×2): qty 2

## 2024-11-21 MED ORDER — METHOCARBAMOL 500 MG PO TABS
500.0000 mg | ORAL_TABLET | Freq: Four times a day (QID) | ORAL | Status: DC | PRN
  Administered 2024-11-22 – 2024-11-24 (×4): 500 mg via ORAL
  Filled 2024-11-21 (×4): qty 1

## 2024-11-21 MED ORDER — PHENOL 1.4 % MT LIQD: 1.0000 | OROMUCOSAL | Status: DC | PRN

## 2024-11-21 MED ORDER — PROPOFOL 500 MG/50ML IV EMUL
INTRAVENOUS | Status: DC | PRN
  Administered 2024-11-21: 15:00:00 75 ug/kg/min via INTRAVENOUS

## 2024-11-21 MED ORDER — PROPOFOL 1000 MG/100ML IV EMUL
INTRAVENOUS | Status: AC
  Filled 2024-11-21: qty 100

## 2024-11-21 MED ORDER — OXYCODONE HCL 5 MG PO TABS: 5.0000 mg | ORAL_TABLET | Freq: Once | ORAL | Status: DC | PRN

## 2024-11-21 MED ORDER — LACTATED RINGERS IV SOLN: INTRAVENOUS | Status: DC

## 2024-11-21 MED ORDER — ACETAMINOPHEN 500 MG PO TABS
500.0000 mg | ORAL_TABLET | Freq: Four times a day (QID) | ORAL | Status: AC
  Administered 2024-11-22 (×2): 500 mg via ORAL
  Filled 2024-11-21 (×2): qty 1

## 2024-11-21 MED ORDER — BUPIVACAINE LIPOSOME 1.3 % IJ SUSP
INTRAMUSCULAR | Status: AC
  Filled 2024-11-21: qty 20

## 2024-11-21 MED ORDER — ASPIRIN 81 MG PO CHEW
81.0000 mg | CHEWABLE_TABLET | Freq: Two times a day (BID) | ORAL | Status: DC
  Administered 2024-11-22 – 2024-11-24 (×5): 81 mg via ORAL
  Filled 2024-11-21 (×6): qty 1

## 2024-11-21 MED ORDER — 0.9 % SODIUM CHLORIDE (POUR BTL) OPTIME
TOPICAL | Status: DC | PRN
  Administered 2024-11-21: 15:00:00 1000 mL

## 2024-11-21 MED ORDER — BUPIVACAINE HCL (PF) 0.25 % IJ SOLN
INTRAMUSCULAR | Status: DC | PRN
  Administered 2024-11-21: 14:00:00 20 mL via PERINEURAL

## 2024-11-21 MED ORDER — PHENYLEPHRINE HCL-NACL 20-0.9 MG/250ML-% IV SOLN
INTRAVENOUS | Status: DC | PRN
  Administered 2024-11-21: 15:00:00 20 ug/min via INTRAVENOUS

## 2024-11-21 MED ORDER — BUPIVACAINE LIPOSOME 1.3 % IJ SUSP: 20.0000 mL | Freq: Once | INTRAMUSCULAR | Status: DC

## 2024-11-21 MED ORDER — SODIUM CHLORIDE 0.9 % IR SOLN
Status: DC | PRN
  Administered 2024-11-21: 15:00:00 3000 mL

## 2024-11-21 MED ORDER — FENTANYL CITRATE (PF) 100 MCG/2ML IJ SOLN
INTRAMUSCULAR | Status: AC
  Filled 2024-11-21: qty 2

## 2024-11-21 MED ORDER — BUPIVACAINE IN DEXTROSE 0.75-8.25 % IT SOLN
INTRATHECAL | Status: DC | PRN
  Administered 2024-11-21: 15:00:00 1.9 mL via INTRATHECAL

## 2024-11-21 MED ORDER — ONDANSETRON HCL 4 MG/2ML IJ SOLN
INTRAMUSCULAR | Status: DC | PRN
  Administered 2024-11-21: 15:00:00 4 mg via INTRAVENOUS

## 2024-11-21 MED ORDER — HYDROCODONE-ACETAMINOPHEN 7.5-325 MG PO TABS
1.0000 | ORAL_TABLET | ORAL | Status: DC | PRN
  Filled 2024-11-21: qty 1

## 2024-11-21 MED ORDER — CHLORHEXIDINE GLUCONATE 0.12 % MT SOLN
15.0000 mL | Freq: Once | OROMUCOSAL | Status: AC
  Administered 2024-11-21: 12:00:00 15 mL via OROMUCOSAL

## 2024-11-21 MED ORDER — TRANEXAMIC ACID-NACL 1000-0.7 MG/100ML-% IV SOLN
1000.0000 mg | INTRAVENOUS | Status: AC
  Administered 2024-11-21: 15:00:00 1000 mg via INTRAVENOUS
  Filled 2024-11-21: qty 100

## 2024-11-21 MED ORDER — CEFAZOLIN SODIUM-DEXTROSE 2-4 GM/100ML-% IV SOLN
2.0000 g | Freq: Four times a day (QID) | INTRAVENOUS | Status: AC
  Administered 2024-11-21 – 2024-11-22 (×2): 2 g via INTRAVENOUS
  Filled 2024-11-21 (×2): qty 100

## 2024-11-21 MED ORDER — DIPHENHYDRAMINE HCL 12.5 MG/5ML PO ELIX: 12.5000 mg | ORAL_SOLUTION | ORAL | Status: DC | PRN

## 2024-11-21 MED ORDER — DEXAMETHASONE SOD PHOSPHATE PF 10 MG/ML IJ SOLN
INTRAMUSCULAR | Status: DC | PRN
  Administered 2024-11-21: 15:00:00 4 mg via INTRAVENOUS

## 2024-11-21 MED ORDER — CEFAZOLIN SODIUM-DEXTROSE 3-4 GM/150ML-% IV SOLN
3.0000 g | INTRAVENOUS | Status: AC
  Administered 2024-11-21: 15:00:00 3 g via INTRAVENOUS
  Filled 2024-11-21: qty 150

## 2024-11-21 MED ORDER — ALUM & MAG HYDROXIDE-SIMETH 200-200-20 MG/5ML PO SUSP: 30.0000 mL | ORAL | Status: DC | PRN

## 2024-11-21 MED ORDER — HYDROCODONE-ACETAMINOPHEN 5-325 MG PO TABS
1.0000 | ORAL_TABLET | ORAL | Status: DC | PRN
  Administered 2024-11-21: 1 via ORAL
  Filled 2024-11-21 (×2): qty 1

## 2024-11-21 MED ORDER — ORAL CARE MOUTH RINSE: 15.0000 mL | Freq: Once | OROMUCOSAL | Status: AC

## 2024-11-21 MED ORDER — MENTHOL 3 MG MT LOZG: 1.0000 | LOZENGE | OROMUCOSAL | Status: DC | PRN

## 2024-11-21 MED ORDER — ACETAMINOPHEN 10 MG/ML IV SOLN: 1000.0000 mg | Freq: Once | INTRAVENOUS | Status: DC | PRN

## 2024-11-21 MED ORDER — FENTANYL CITRATE (PF) 50 MCG/ML IJ SOSY: 25.0000 ug | PREFILLED_SYRINGE | INTRAMUSCULAR | Status: DC | PRN

## 2024-11-21 SURGICAL SUPPLY — 50 items
ATTUNE MED DOME PAT 41 KNEE (Knees) IMPLANT
ATTUNE PS FEM LT SZ 6 CEM KNEE (Femur) IMPLANT
ATTUNE PSRP INSR SZ6 6 KNEE (Insert) IMPLANT
BAG COUNTER SPONGE SURGICOUNT (BAG) ×1 IMPLANT
BAG DECANTER FOR FLEXI CONT (MISCELLANEOUS) ×1 IMPLANT
BAG ZIPLOCK 12X15 (MISCELLANEOUS) ×1 IMPLANT
BASE TIBIAL ROT PLAT SZ 8 KNEE (Knees) IMPLANT
BLADE SAGITTAL 25.0X1.19X90 (BLADE) ×1 IMPLANT
BLADE SAW SGTL 11.0X1.19X90.0M (BLADE) ×1 IMPLANT
BLADE SURG SZ10 CARB STEEL (BLADE) ×1 IMPLANT
BNDG ELASTIC 6INX 5YD STR LF (GAUZE/BANDAGES/DRESSINGS) ×1 IMPLANT
BOOTIES KNEE HIGH SLOAN (MISCELLANEOUS) ×1 IMPLANT
BOWL SMART MIX CTS (DISPOSABLE) ×1 IMPLANT
CEMENT HV SMART SET (Cement) ×2 IMPLANT
COVER SURGICAL LIGHT HANDLE (MISCELLANEOUS) ×1 IMPLANT
CUFF TRNQT CYL 34X4.125X (TOURNIQUET CUFF) ×1 IMPLANT
DRAPE TOP 10253 STERILE (DRAPES) ×1 IMPLANT
DRAPE U-SHAPE 47X51 STRL (DRAPES) ×1 IMPLANT
DRSG AQUACEL AG ADV 3.5X10 (GAUZE/BANDAGES/DRESSINGS) ×1 IMPLANT
DURAPREP 26ML APPLICATOR (WOUND CARE) ×2 IMPLANT
ELECT PENCIL ROCKER SW 15FT (MISCELLANEOUS) ×1 IMPLANT
ELECT REM PT RETURN 15FT ADLT (MISCELLANEOUS) ×1 IMPLANT
GLOVE BIO SURGEON STRL SZ8 (GLOVE) ×2 IMPLANT
GLOVE BIOGEL PI IND STRL 7.0 (GLOVE) ×1 IMPLANT
GLOVE BIOGEL PI IND STRL 8 (GLOVE) ×2 IMPLANT
GLOVE SURG SYN 7.0 PF PI (GLOVE) ×1 IMPLANT
GOWN SRG XL LVL 4 BRTHBL STRL (GOWNS) ×1 IMPLANT
GOWN STRL REUS W/ TWL XL LVL3 (GOWN DISPOSABLE) ×2 IMPLANT
HOLDER FOLEY CATH W/STRAP (MISCELLANEOUS) IMPLANT
HOOD PEEL AWAY T7 (MISCELLANEOUS) ×3 IMPLANT
KIT TURNOVER KIT A (KITS) ×1 IMPLANT
MANIFOLD NEPTUNE II (INSTRUMENTS) ×1 IMPLANT
NS IRRIG 1000ML POUR BTL (IV SOLUTION) ×1 IMPLANT
PACK TOTAL KNEE CUSTOM (KITS) ×1 IMPLANT
PAD ARMBOARD POSITIONER FOAM (MISCELLANEOUS) ×1 IMPLANT
PIN STEINMAN FIXATION KNEE (PIN) IMPLANT
PROTECTOR NERVE ULNAR (MISCELLANEOUS) ×1 IMPLANT
SET HNDPC FAN SPRY TIP SCT (DISPOSABLE) ×1 IMPLANT
SPIKE FLUID TRANSFER (MISCELLANEOUS) ×2 IMPLANT
STRIP CLOSURE SKIN 1/2X4 (GAUZE/BANDAGES/DRESSINGS) IMPLANT
SUT ETHIBOND NAB CT1 #1 30IN (SUTURE) ×1 IMPLANT
SUT VIC AB 0 CT1 36 (SUTURE) ×1 IMPLANT
SUT VIC AB 2-0 CT1 TAPERPNT 27 (SUTURE) ×1 IMPLANT
SUT VICRYL AB 3-0 FS1 BRD 27IN (SUTURE) ×1 IMPLANT
SUTURE STRATFX 0 PDS 27 VIOLET (SUTURE) ×1 IMPLANT
TRAY FOLEY MTR SLVR 14FR STAT (SET/KITS/TRAYS/PACK) IMPLANT
TRAY FOLEY MTR SLVR 16FR STAT (SET/KITS/TRAYS/PACK) IMPLANT
WATER STERILE IRR 1000ML POUR (IV SOLUTION) ×2 IMPLANT
WRAP KNEE MAXI GEL POST OP (GAUZE/BANDAGES/DRESSINGS) ×1 IMPLANT
YANKAUER SUCT BULB TIP NO VENT (SUCTIONS) ×1 IMPLANT

## 2024-11-21 NOTE — Anesthesia Procedure Notes (Signed)
 Spinal  Patient location during procedure: OR Start time: 11/21/2024 2:53 PM End time: 11/21/2024 2:55 PM Reason for block: surgical anesthesia  Staffing Performed: anesthesiologist  Authorized by: Boone Fess, MD   Performed by: Boone Fess, MD  Preanesthetic Checklist Completed: patient identified, IV checked, site marked, risks and benefits discussed, surgical consent, monitors and equipment checked, pre-op evaluation and timeout performed Spinal Block Patient position: sitting Prep: ChloraPrep and site prepped and draped Patient monitoring: heart rate, continuous pulse ox, blood pressure and cardiac monitor Approach: midline Location: L3-4 Injection technique: single-shot Needle Needle type: Quincke  Needle gauge: 22 G Needle length: 9 cm Assessment Sensory level: T10 Events: CSF return  Additional Notes Meticulous sterile technique used throughout (CHG prep, sterile gloves, sterile drape). Negative paresthesia. Negative blood return. Positive free-flowing CSF. Expiration date of kit checked and confirmed. Patient tolerated procedure well, without complications.

## 2024-11-21 NOTE — Anesthesia Procedure Notes (Signed)
 Anesthesia Regional Block: Adductor canal block   Pre-Anesthetic Checklist: , timeout performed,  Correct Patient, Correct Site, Correct Laterality,  Correct Procedure, Correct Position, site marked,  Risks and benefits discussed,  Surgical consent,  Pre-op evaluation,  At surgeon's request and post-op pain management  Laterality: Lower and Left  Prep: chloraprep       Needles:  Injection technique: Single-shot  Needle Type: Echogenic Needle     Needle Length: 9cm  Needle Gauge: 21     Additional Needles:   Procedures:,,,, ultrasound used (permanent image in chart),,    Narrative:  Start time: 11/21/2024 1:35 PM End time: 11/21/2024 1:38 PM Injection made incrementally with aspirations every 5 mL.  Performed by: Personally  Anesthesiologist: Boone Fess, MD  Additional Notes: Patient's chart reviewed and they were deemed appropriate candidate for procedure, per surgeon's request. Patient educated about risks, benefits, and alternatives of the block including but not limited to: temporary or permanent nerve damage, bleeding, infection, damage to surround tissues, block failure, local anesthetic toxicity. Patient expressed understanding. A formal time-out was conducted consistent with institution rules.  Monitors were applied, and minimal sedation used (see nursing record). The site was prepped with skin prep and allowed to dry, and sterile gloves were used. A high frequency linear ultrasound probe with probe cover was utilized throughout. Femoral artery visualized at mid-thigh level, local anesthetic injected anterolateral to it, and echogenic block needle trajectory was monitored throughout. Hydrodissection of saphenous nerve visualized and appeared anatomically normal. Aspiration performed every 5ml. Blood vessels were avoided. All injections were performed without resistance and free of blood and paresthesias. The patient tolerated the procedure well.  Injectate: 20ml 0.25%  bupivacaine 

## 2024-11-21 NOTE — Anesthesia Postprocedure Evaluation (Signed)
 Anesthesia Post Note  Patient: Paige Cameron  Procedure(s) Performed: ARTHROPLASTY, KNEE, TOTAL (Left: Knee)     Patient location during evaluation: PACU Anesthesia Type: Spinal, Regional and MAC Level of consciousness: awake and alert and oriented Pain management: pain level controlled Vital Signs Assessment: post-procedure vital signs reviewed and stable Respiratory status: spontaneous breathing, nonlabored ventilation and respiratory function stable Cardiovascular status: blood pressure returned to baseline and stable Postop Assessment: no headache, no backache, spinal receding and no apparent nausea or vomiting Anesthetic complications: no   No notable events documented.  Last Vitals:  Vitals:   11/21/24 1800 11/21/24 1815  BP: 124/77 125/78  Pulse: (!) 57 (!) 59  Resp: 14 11  Temp:    SpO2: 94% 97%    Last Pain:  Vitals:   11/21/24 1815  TempSrc:   PainSc: 0-No pain                 [name]

## 2024-11-21 NOTE — Op Note (Signed)
 PREOP DIAGNOSIS: DJD LEFT KNEE POSTOP DIAGNOSIS:  same PROCEDURE: LEFT TKR ANESTHESIA: Spinal and MAC ATTENDING SURGEON: Paige Cameron ASSISTANT: Paige Earl PA  INDICATIONS FOR PROCEDURE: Paige Cameron is a 53 y.o. female who has struggled for a long time with pain due to degenerative arthritis of the left knee.  The patient has failed many conservative non-operative measures and at this point has pain which limits the ability to sleep and walk.  The patient is offered total knee replacement.  Informed operative consent was obtained after discussion of possible risks of anesthesia, infection, neurovascular injury, DVT, and death.  The importance of the post-operative rehabilitation protocol to optimize result was stressed extensively with the patient.  SUMMARY OF FINDINGS AND PROCEDURE:  Paige Cameron was taken to the operative suite where under the above anesthesia a left knee replacement was performed.  There were advanced degenerative changes and the bone quality was excellent.  We used the DePuyAttune system and placed size 6 femur, 8 tibia, 6 mm all polyethylene patella, and a size 41 mm spacer.  Paige Earl PA-C assisted throughout and was invaluable to the completion of the case in that he helped retract and maintain exposure while I placed the components.  He also helped close thereby minimizing OR time.  The patient was admitted for appropriate post-op care to include perioperative antibiotics and mechanical and pharmacologic measures for DVT prophylaxis.  DESCRIPTION OF PROCEDURE:  Paige Cameron was taken to the operative suite where the above anesthesia was applied.  The patient was positioned supine and prepped and draped in normal sterile fashion.  An appropriate time out was performed.  After the administration of kefzol  pre-op antibiotic the leg was elevated and exsanguinated and a tourniquet inflated.  A standard longitudinal incision was made on the anterior knee.  Dissection  was carried down to the extensor mechanism.  All appropriate anti-infective measures were used including the pre-operative antibiotic, betadine  impregnated drape, and closed hooded exhaust systems for each member of the surgical team.  A medial parapatellar incision was made in the extensor mechanism and the knee cap flipped and the knee flexed.  Some residual meniscal tissues were removed along with any remaining ACL/PCL tissue.  A guide was placed on the tibia and a flat cut was made on it's superior surface.  An intramedullary guide was placed in the femur and was utilized to make anterior and posterior cuts creating an appropriate flexion gap.  A second intramedullary guide was placed in the femur to make a distal cut properly balancing the knee with an extension gap equal to the flexion gap.  The three bones sized to the above mentioned sizes and the appropriate guides were placed and utilized.  A trial reduction was done and the knee easily came to full extension and the patella tracked well on flexion.  The trial components were removed and all bones were cleaned with pulsatile lavage and then dried thoroughly.  Cement was mixed and was pressurized onto the bones followed by placement of the aforementioned components.  Excess cement was trimmed and pressure was held on the components until the cement had hardened.  The tourniquet was deflated and a small amount of bleeding was controlled with cautery and pressure.  The knee was irrigated thoroughly.  The extensor mechanism was re-approximated with #1 ethibond in interrupted fashion.  The knee was flexed and the repair was solid.  The subcutaneous tissues were re-approximated with #0 and #2-0 vicryl and the skin closed with a  subcuticular stitch and steristrips.  A sterile dressing was applied.  Intraoperative fluids, EBL, and tourniquet time can be obtained from anesthesia records.  DISPOSITION:  The patient was taken to recovery room in stable condition  and scheduled to potentially go home same day depending on ability to walk and tolerate liquids.  Paige Cameron G Hyun Marsalis 11/21/2024, 4:12 PM

## 2024-11-21 NOTE — Transfer of Care (Signed)
 Immediate Anesthesia Transfer of Care Note  Patient: Paige Cameron  Procedure(s) Performed: ARTHROPLASTY, KNEE, TOTAL (Left: Knee)  Patient Location: PACU  Anesthesia Type:MAC and Spinal  Level of Consciousness: drowsy  Airway & Oxygen Therapy: Patient Spontanous Breathing and Patient connected to face mask oxygen  Post-op Assessment: Report given to RN and Post -op Vital signs reviewed and stable  Post vital signs: Reviewed and stable  Last Vitals:  Vitals Value Taken Time  BP 104/64 11/21/24 16:45  Temp 97.1   Pulse 61 11/21/24 16:46  Resp 11 11/21/24 16:46  SpO2 99 % 11/21/24 16:46  Vitals shown include unfiled device data.  Last Pain:  Vitals:   11/21/24 1142  TempSrc: Oral  PainSc: 0-No pain         Complications: No notable events documented.

## 2024-11-21 NOTE — Plan of Care (Signed)
°  Problem: Coping: Goal: Level of anxiety will decrease 11/21/2024 1935 by Trudy Maus, RN Outcome: Progressing 11/21/2024 1935 by Trudy Maus, RN Outcome: Progressing   Problem: Pain Managment: Goal: General experience of comfort will improve and/or be controlled 11/21/2024 1935 by Trudy Maus, RN Outcome: Progressing 11/21/2024 1935 by Trudy Maus, RN Outcome: Progressing   Problem: Safety: Goal: Ability to remain free from injury will improve 11/21/2024 1935 by Trudy Maus, RN Outcome: Progressing 11/21/2024 1935 by Trudy Maus, RN Outcome: Progressing

## 2024-11-22 DIAGNOSIS — M1712 Unilateral primary osteoarthritis, left knee: Secondary | ICD-10-CM | POA: Diagnosis not present

## 2024-11-22 MED ORDER — ASPIRIN 81 MG PO CHEW: 81.0000 mg | CHEWABLE_TABLET | Freq: Two times a day (BID) | ORAL | 0 refills | Status: AC

## 2024-11-22 NOTE — Discharge Summary (Cosign Needed Addendum)
 Patient ID: Paige Cameron MRN: [id number] DOB/AGE: 1971-01-29 53 y.o.  Admit date: 11/21/2024 Discharge date: 11/23/2024  Admission Diagnoses:  Principal Problem:   Primary localized osteoarthritis of left knee   Discharge Diagnoses:  Same  Past Medical History:  Diagnosis Date   Arthritis    Asthma     Surgeries: Procedures: ARTHROPLASTY, KNEE, TOTAL on 11/21/2024   Consultants:   Discharged Condition: Improved  Hospital Course: Paige Cameron is an 52 y.o. female who was admitted 11/21/2024 for operative treatment ofPrimary localized osteoarthritis of left knee. Patient has severe unremitting pain that affects sleep, daily activities, and work/hobbies. After pre-op clearance the patient was taken to the operating room on 11/21/2024 and underwent  Procedures: ARTHROPLASTY, KNEE, TOTAL.    Patient was given perioperative antibiotics:  Anti-infectives (From admission, onward)    Start     Dose/Rate Route Frequency Ordered Stop   11/21/24 2100  ceFAZolin  (ANCEF ) IVPB 2g/100 mL premix        2 g 200 mL/hr over 30 Minutes Intravenous Every 6 hours 11/21/24 1930 11/22/24 0301   11/21/24 1130  ceFAZolin  (ANCEF ) IVPB 3g/150 mL premix        3 g 300 mL/hr over 30 Minutes Intravenous On call to O.R. 11/21/24 1126 11/21/24 1506        Patient was given sequential compression devices, early ambulation, and chemoprophylaxis to prevent DVT.  Patient benefited maximally from hospital stay and there were no complications.    Recent vital signs: Patient Vitals for the past 24 hrs:  BP Temp Temp src Pulse Resp SpO2 Height Weight  11/22/24 0633 114/63 98 F (36.7 C) Oral (!) 57 17 99 % -- --  11/22/24 0102 108/72 97.9 F (36.6 C) Oral 91 17 98 % -- --  11/21/24 2210 118/70 98.5 F (36.9 C) Oral 79 17 97 % -- --  11/21/24 1927 101/78 97.8 F (36.6 C) Oral 63 18 99 % -- --  11/21/24 1900 139/85 -- -- (!) 59 13 98 % -- --  11/21/24 1845 131/79 -- -- (!) 57 10 98 % -- --   11/21/24 1830 128/78 -- -- (!) 58 17 97 % -- --  11/21/24 1815 125/78 -- -- (!) 59 11 97 % -- --  11/21/24 1800 124/77 -- -- (!) 57 14 94 % -- --  11/21/24 1745 130/76 -- -- (!) 57 14 95 % -- --  11/21/24 1730 121/75 -- -- (!) 57 13 96 % -- --  11/21/24 1715 120/77 -- -- (!) 56 14 100 % -- --  11/21/24 1700 115/71 -- -- (!) 55 13 100 % -- --  11/21/24 1645 104/64 97.8 F (36.6 C) -- (!) 58 12 99 % -- --  11/21/24 1337 132/81 -- -- 63 16 99 % -- --  11/21/24 1332 133/74 -- -- 67 13 100 % -- --  11/21/24 1143 139/70 -- -- -- -- 98 % -- --  11/21/24 1142 139/70 98.7 F (37.1 C) Oral 87 16 96 % 6' (1.829 m) 114.8 kg     Recent laboratory studies: No results for input(s): WBC, HGB, HCT, PLT, NA, K, CL, CO2, BUN, CREATININE, GLUCOSE, INR, CALCIUM in the last 72 hours.  Invalid input(s): PT, 2   Discharge Medications:   Allergies as of 11/22/2024   No Known Allergies      Medication List     TAKE these medications    aspirin  81 MG chewable tablet Chew 1 tablet (81 mg total) by  mouth 2 (two) times daily. For 2 weeks then once a day for 2 weeks for DVT prevention.   HAIR SKIN NAILS PO Take 1 tablet by mouth daily.   HYDROcodone -acetaminophen  5-325 MG tablet Commonly known as: NORCO/VICODIN Take 1-2 tablets by mouth every 4 (four) hours as needed for moderate pain (pain score 4-6).   methocarbamol  500 MG tablet Commonly known as: ROBAXIN  Take 1 tablet (500 mg total) by mouth every 6 (six) hours as needed for muscle spasms.               Durable Medical Equipment  (From admission, onward)           Start     Ordered   11/21/24 1931  DME Walker rolling  Once       Question:  Patient needs a walker to treat with the following condition  Answer:  Primary osteoarthritis of left knee   11/21/24 1930   11/21/24 1931  DME 3 n 1  Once        11/21/24 1930   11/21/24 1931  DME Bedside commode  Once       Question:  Patient needs a  bedside commode to treat with the following condition  Answer:  Primary osteoarthritis of left knee   11/21/24 1930            Diagnostic Studies: No results found.  Disposition: Discharge disposition: 01-Home or Self Care       Discharge Instructions     Call MD / Call 911   Complete by: As directed    If you experience chest pain or shortness of breath, CALL 911 and be transported to the hospital emergency room.  If you develope a fever above 101 F, pus (white drainage) or increased drainage or redness at the wound, or calf pain, call your surgeon's office.   Constipation Prevention   Complete by: As directed    Drink plenty of fluids.  Prune juice may be helpful.  You may use a stool softener, such as Colace (over the counter) 100 mg twice a day.  Use MiraLax (over the counter) for constipation as needed.   Discharge instructions   Complete by: As directed    INSTRUCTIONS AFTER JOINT REPLACEMENT   Remove items at home which could result in a fall. This includes throw rugs or furniture in walking pathways ICE to the affected joint every three hours while awake for 30 minutes at a time, for at least the first 3-5 days, and then as needed for pain and swelling.  Continue to use ice for pain and swelling. You may notice swelling that will progress down to the foot and ankle.  This is normal after surgery.  Elevate your leg when you are not up walking on it.   Continue to use the breathing machine you got in the hospital (incentive spirometer) which will help keep your temperature down.  It is common for your temperature to cycle up and down following surgery, especially at night when you are not up moving around and exerting yourself.  The breathing machine keeps your lungs expanded and your temperature down.   DIET:  As you were doing prior to hospitalization, we recommend a well-balanced diet.  DRESSING / WOUND CARE / SHOWERING  You may shower 3 days after surgery, but keep  the wounds dry during showering.  You may use an occlusive plastic wrap (Press'n Seal for example), NO SOAKING/SUBMERGING IN THE BATHTUB.  If the bandage  gets wet, change with a clean dry gauze.  If the incision gets wet, pat the wound dry with a clean towel.  ACTIVITY  Increase activity slowly as tolerated, but follow the weight bearing instructions below.   No driving for 6 weeks or until further direction given by your physician.  You cannot drive while taking narcotics.  No lifting or carrying greater than 10 lbs. until further directed by your surgeon. Avoid periods of inactivity such as sitting longer than an hour when not asleep. This helps prevent blood clots.  You may return to work once you are authorized by your doctor.     WEIGHT BEARING   Weight bearing as tolerated with assist device (walker, cane, etc) as directed, use it as long as suggested by your surgeon or therapist, typically at least 4-6 weeks.   EXERCISES  Results after joint replacement surgery are often greatly improved when you follow the exercise, range of motion and muscle strengthening exercises prescribed by your doctor. Safety measures are also important to protect the joint from further injury. Any time any of these exercises cause you to have increased pain or swelling, decrease what you are doing until you are comfortable again and then slowly increase them. If you have problems or questions, call your caregiver or physical therapist for advice.   Rehabilitation is important following a joint replacement. After just a few days of immobilization, the muscles of the leg can become weakened and shrink (atrophy).  These exercises are designed to build up the tone and strength of the thigh and leg muscles and to improve motion. Often times heat used for twenty to thirty minutes before working out will loosen up your tissues and help with improving the range of motion but do not use heat for the first two weeks  following surgery (sometimes heat can increase post-operative swelling).   These exercises can be done on a training (exercise) mat, on the floor, on a table or on a bed. Use whatever works the best and is most comfortable for you.    Use music or television while you are exercising so that the exercises are a pleasant break in your day. This will make your life better with the exercises acting as a break in your routine that you can look forward to.   Perform all exercises about fifteen times, three times per day or as directed.  You should exercise both the operative leg and the other leg as well.  Exercises include:   Quad Sets - Tighten up the muscle on the front of the thigh (Quad) and hold for 5-10 seconds.   Straight Leg Raises - With your knee straight (if you were given a brace, keep it on), lift the leg to 60 degrees, hold for 3 seconds, and slowly lower the leg.  Perform this exercise against resistance later as your leg gets stronger.  Leg Slides: Lying on your back, slowly slide your foot toward your buttocks, bending your knee up off the floor (only go as far as is comfortable). Then slowly slide your foot back down until your leg is flat on the floor again.  Angel Wings: Lying on your back spread your legs to the side as far apart as you can without causing discomfort.  Hamstring Strength:  Lying on your back, push your heel against the floor with your leg straight by tightening up the muscles of your buttocks.  Repeat, but this time bend your knee to a comfortable angle, and  push your heel against the floor.  You may put a pillow under the heel to make it more comfortable if necessary.   A rehabilitation program following joint replacement surgery can speed recovery and prevent re-injury in the future due to weakened muscles. Contact your doctor or a physical therapist for more information on knee rehabilitation.    CONSTIPATION  Constipation is defined medically as fewer than three  stools per week and severe constipation as less than one stool per week.  Even if you have a regular bowel pattern at home, your normal regimen is likely to be disrupted due to multiple reasons following surgery.  Combination of anesthesia, postoperative narcotics, change in appetite and fluid intake all can affect your bowels.   YOU MUST use at least one of the following options; they are listed in order of increasing strength to get the job done.  They are all available over the counter, and you may need to use some, POSSIBLY even all of these options:    Drink plenty of fluids (prune juice may be helpful) and high fiber foods Colace 100 mg by mouth twice a day  Senokot for constipation as directed and as needed Dulcolax (bisacodyl ), take with full glass of water   Miralax (polyethylene glycol) once or twice a day as needed.  If you have tried all these things and are unable to have a bowel movement in the first 3-4 days after surgery call either your surgeon or your primary doctor.    If you experience loose stools or diarrhea, hold the medications until you stool forms back up.  If your symptoms do not get better within 1 week or if they get worse, check with your doctor.  If you experience the worst abdominal pain ever or develop nausea or vomiting, please contact the office immediately for further recommendations for treatment.   ITCHING:  If you experience itching with your medications, try taking only a single pain pill, or even half a pain pill at a time.  You can also use Benadryl  over the counter for itching or also to help with sleep.   TED HOSE STOCKINGS:  Use stockings on both legs until for at least 2 weeks or as directed by physician office. They may be removed at night for sleeping.  MEDICATIONS:  See your medication summary on the After Visit Summary that nursing will review with you.  You may have some home medications which will be placed on hold until you complete the course  of blood thinner medication.  It is important for you to complete the blood thinner medication as prescribed.  PRECAUTIONS:  If you experience chest pain or shortness of breath - call 911 immediately for transfer to the hospital emergency department.   If you develop a fever greater that 101 F, purulent drainage from wound, increased redness or drainage from wound, foul odor from the wound/dressing, or calf pain - CONTACT YOUR SURGEON.                                                   FOLLOW-UP APPOINTMENTS:  If you do not already have a post-op appointment, please call the office for an appointment to be seen by your surgeon.  Guidelines for how soon to be seen are listed in your After Visit Summary, but are typically between 1-4 weeks  after surgery.  OTHER INSTRUCTIONS:   Knee Replacement:  Do not place pillow under knee, focus on keeping the knee straight while resting. CPM instructions: 0-90 degrees, 2 hours in the morning, 2 hours in the afternoon, and 2 hours in the evening. Place foam block, curve side up under heel at all times except when in CPM or when walking.  DO NOT modify, tear, cut, or change the foam block in any way.  POST-OPERATIVE OPIOID TAPER INSTRUCTIONS: It is important to wean off of your opioid medication as soon as possible. If you do not need pain medication after your surgery it is ok to stop day one. Opioids include: Codeine, Hydrocodone (Norco, Vicodin), Oxycodone (Percocet, oxycontin ) and hydromorphone amongst others.  Long term and even short term use of opiods can cause: Increased pain response Dependence Constipation Depression Respiratory depression And more.  Withdrawal symptoms can include Flu like symptoms Nausea, vomiting And more Techniques to manage these symptoms Hydrate well Eat regular healthy meals Stay active Use relaxation techniques(deep breathing, meditating, yoga) Do Not substitute Alcohol to help with tapering If you have been on  opioids for less than two weeks and do not have pain than it is ok to stop all together.  Plan to wean off of opioids This plan should start within one week post op of your joint replacement. Maintain the same interval or time between taking each dose and first decrease the dose.  Cut the total daily intake of opioids by one tablet each day Next start to increase the time between doses. The last dose that should be eliminated is the evening dose.     MAKE SURE YOU:  Understand these instructions.  Get help right away if you are not doing well or get worse.    Thank you for letting us  be a part of your medical care team.  It is a privilege we respect greatly.  We hope these instructions will help you stay on track for a fast and full recovery!   Increase activity slowly as tolerated   Complete by: As directed    Post-operative opioid taper instructions:   Complete by: As directed    POST-OPERATIVE OPIOID TAPER INSTRUCTIONS: It is important to wean off of your opioid medication as soon as possible. If you do not need pain medication after your surgery it is ok to stop day one. Opioids include: Codeine, Hydrocodone (Norco, Vicodin), Oxycodone (Percocet, oxycontin ) and hydromorphone amongst others.  Long term and even short term use of opiods can cause: Increased pain response Dependence Constipation Depression Respiratory depression And more.  Withdrawal symptoms can include Flu like symptoms Nausea, vomiting And more Techniques to manage these symptoms Hydrate well Eat regular healthy meals Stay active Use relaxation techniques(deep breathing, meditating, yoga) Do Not substitute Alcohol to help with tapering If you have been on opioids for less than two weeks and do not have pain than it is ok to stop all together.  Plan to wean off of opioids This plan should start within one week post op of your joint replacement. Maintain the same interval or time between taking each dose  and first decrease the dose.  Cut the total daily intake of opioids by one tablet each day Next start to increase the time between doses. The last dose that should be eliminated is the evening dose.           Follow-up Information     Sheril Coy, MD. Schedule an appointment as soon as possible for a visit in  2 week(s).   Specialty: Orthopedic Surgery Contact information: [address] [place] [address] [phone]                  Signed: Prentice Mt Yousof Alderman 11/22/2024, 8:12 AM

## 2024-11-22 NOTE — Progress Notes (Signed)
 Physical Therapy Treatment Patient Details Name: Paige Cameron MRN: [id number] DOB: Apr 05, 1971 Today's Date: 11/22/2024   History of Present Illness 53 yo female s/P  LTKA 11/21/24. PMH: asthma    PT Comments  The patient is progressing well, reports limited pain.  Patient waiting on DME. Continue PT tomorrow if remains in house. Reviewed HEP and negotiating 1 step, elevation of the LLE.   If plan is discharge home, recommend the following: A little help with bathing/dressing/bathroom;Assist for transportation;Help with stairs or ramp for entrance   Can travel by private vehicle        Equipment Recommendations  Rolling walker (2 wheels);BSC/3in1    Recommendations for Other Services       Precautions / Restrictions Precautions Precautions: Fall;Knee Restrictions LLE Weight Bearing Per Provider Order: Weight bearing as tolerated     Mobility  Bed Mobility Overal bed mobility: Needs Assistance Bed Mobility: Sit to Supine     Supine to sit: Modified independent (Device/Increase time)     General bed mobility comments: able to lift leg onto bed    Transfers Overall transfer level: Needs assistance Equipment used: Rolling walker (2 wheels) Transfers: Sit to/from Stand Sit to Stand: Contact guard assist           General transfer comment: cues for hand and LLE position,    Ambulation/Gait Ambulation/Gait assistance: Contact guard assist Gait Distance (Feet): 100 Feet Assistive device: Rolling walker (2 wheels) Gait Pattern/deviations: Step-to pattern, Step-through pattern Gait velocity: decr     General Gait Details:   Stairs             Wheelchair Mobility     Tilt Bed    Modified Rankin (Stroke Patients Only)       Balance Overall balance assessment: Mild deficits observed, not formally tested                                          Communication Communication Communication: No apparent difficulties   Cognition Arousal: Alert Behavior During Therapy: WFL for tasks assessed/performed   PT - Cognitive impairments: No apparent impairments                         Following commands: Intact      Cueing    Exercises    General Comments        Pertinent Vitals/Pain Pain Assessment Pain Assessment: Faces Faces Pain Scale: Hurts little more Pain Location: left thigh Pain Descriptors / Indicators: Discomfort Pain Intervention(s): Premedicated before session, Repositioned    Home Living Family/patient expects to be discharged to:: Private residence Living Arrangements: Alone;Non-relatives/Friends Available Help at Discharge: Available PRN/intermittently Type of Home: House       Alternate Level Stairs-Number of Steps: 12 Home Layout: Two level;Able to live on main level with bedroom/bathroom        Prior Function            PT Goals (current goals can now be found in the care plan section) Acute Rehab PT Goals Patient Stated Goal: go home, back to work PT Goal Formulation: With patient Time For Goal Achievement: 11/29/24 Potential to Achieve Goals: Good Progress towards PT goals: Progressing toward goals    Frequency    7X/week      PT Plan      Co-evaluation  AM-PAC PT 6 Clicks Mobility   Outcome Measure  Help needed turning from your back to your side while in a flat bed without using bedrails?: None Help needed moving from lying on your back to sitting on the side of a flat bed without using bedrails?: None Help needed moving to and from a bed to a chair (including a wheelchair)?: None Help needed standing up from a chair using your arms (e.g., wheelchair or bedside chair)?: None Help needed to walk in hospital room?: A Little Help needed climbing 3-5 steps with a railing? : A Little 6 Click Score: 22    End of Session Equipment Utilized During Treatment: Gait belt Activity Tolerance: Patient tolerated treatment  well Patient left: in bed;with call bell/phone within reach;with bed alarm set Nurse Communication: Mobility status PT Visit Diagnosis: Unsteadiness on feet (R26.81);Difficulty in walking, not elsewhere classified (R26.2);Pain Pain - Right/Left: Left Pain - part of body: Knee     Time: 1215-1245 PT Time Calculation (min) (ACUTE ONLY): 30 min  Charges:    $Gait Training: 8-22 mins $Self Care/Home Management: 8-22 PT General Charges $$ ACUTE PT VISIT: 1 Visit                     Darice Potters PT Acute Rehabilitation Services Office [phone]    Potters Darice Norris 11/22/2024, 1:35 PM

## 2024-11-22 NOTE — Progress Notes (Signed)
 Subjective: 1 Day Post-Op Procedures (LRB): ARTHROPLASTY, KNEE, TOTAL (Left) Patient doing well. She is hoping to go home today.  Activity level:  wbatr Diet tolerance:  ok Voiding:  ok Patient reports pain as mild.    Objective: Vital signs in last 24 hours: Temp:  [97.8 F (36.6 C)-98.7 F (37.1 C)] 98 F (36.7 C) (12/17 9366) Pulse Rate:  [55-91] 57 (12/17 0633) Resp:  [10-18] 17 (12/17 0633) BP: (101-139)/(63-85) 114/63 (12/17 0633) SpO2:  [94 %-100 %] 99 % (12/17 9366) Weight:  [114.8 kg] 114.8 kg (12/16 1142)  Labs: No results for input(s): HGB in the last 72 hours. No results for input(s): WBC, RBC, HCT, PLT in the last 72 hours. No results for input(s): NA, K, CL, CO2, BUN, CREATININE, GLUCOSE, CALCIUM in the last 72 hours. No results for input(s): LABPT, INR in the last 72 hours.  Physical Exam:  Neurologically intact ABD soft Neurovascular intact Sensation intact distally Intact pulses distally Dorsiflexion/Plantar flexion intact Incision: dressing C/D/I and no drainage No cellulitis present Compartment soft  Assessment/Plan:  1 Day Post-Op Procedures (LRB): ARTHROPLASTY, KNEE, TOTAL (Left) Advance diet Up with therapy D/C IV fluids D/c home today if cleared and doing well with PT. Continue on 81mg  asa BID for DVT prevention.  Follow up in office 2 weeks post op.    Prentice Mt Lecretia Buczek 11/22/2024, 8:08 AM

## 2024-11-22 NOTE — Plan of Care (Signed)
   Problem: Coping: Goal: Level of anxiety will decrease Outcome: Progressing   Problem: Pain Managment: Goal: General experience of comfort will improve and/or be controlled Outcome: Progressing   Problem: Safety: Goal: Ability to remain free from injury will improve Outcome: Progressing

## 2024-11-22 NOTE — Evaluation (Signed)
 Physical Therapy Evaluation Patient Details Name: Paige Cameron MRN: [id number] DOB: June 10, 1971 Today's Date: 11/22/2024  History of Present Illness  53 yo female s/P  LTKA 11/21/24. PMH: asthma  Clinical Impression  Pt admitted with above diagnosis.  Pt currently with functional limitations due to the deficits listed below (see PT Problem List). Pt will benefit from acute skilled PT to increase their independence and safety with mobility to allow discharge.     The patient reports left thigh feels tight  but not pain. Patient able to perfor SLR after  several assists. Patient ambulated with RW, slight L knee instability but not buckling. .  Pt will see again prior to Dc today  for review of safety , HEP and 1 step. a  Instructed patient in Ice, elevation, hourly ambulation, no pillow under left knee and HEP .       If plan is discharge home, recommend the following: A little help with bathing/dressing/bathroom;Assist for transportation;Help with stairs or ramp for entrance   Can travel by private vehicle        Equipment Recommendations Rolling walker (2 wheels);BSC/3in1  Recommendations for Other Services       Functional Status Assessment Patient has had a recent decline in their functional status and demonstrates the ability to make significant improvements in function in a reasonable and predictable amount of time.     Precautions / Restrictions Precautions Precautions: Fall;Knee Restrictions LLE Weight Bearing Per Provider Order: Weight bearing as tolerated      Mobility  Bed Mobility Overal bed mobility: Needs Assistance Bed Mobility: Supine to Sit     Supine to sit: Supervision     General bed mobility comments: able to lift LLE to place  to bed edge and down to floor    Transfers Overall transfer level: Needs assistance Equipment used: Rolling walker (2 wheels) Transfers: Sit to/from Stand Sit to Stand: Contact guard assist           General  transfer comment: cues for hand and LLE position, especially to low toilet    Ambulation/Gait Ambulation/Gait assistance: Contact guard assist Gait Distance (Feet): 100 Feet Assistive device: Rolling walker (2 wheels) Gait Pattern/deviations: Step-to pattern, Step-through pattern Gait velocity: decr     General Gait Details: cues for sequence and to roll RW. L knee 'Wobble at stance but no buckling noted.  Stairs            Wheelchair Mobility     Tilt Bed    Modified Rankin (Stroke Patients Only)       Balance Overall balance assessment: Mild deficits observed, not formally tested                                           Pertinent Vitals/Pain Pain Assessment Pain Assessment: Faces Faces Pain Scale: Hurts little more Pain Location: left thigh Pain Descriptors / Indicators: Discomfort Pain Intervention(s): Monitored during session, Ice applied    Home Living Family/patient expects to be discharged to:: Private residence Living Arrangements: Alone;Non-relatives/Friends Available Help at Discharge: Available PRN/intermittently Type of Home: House       Alternate Level Stairs-Number of Steps: 12 Home Layout: Two level;Able to live on main level with bedroom/bathroom        Prior Function Prior Level of Function : Independent/Modified Independent;Driving;Working/employed  Extremity/Trunk Assessment   Upper Extremity Assessment Upper Extremity Assessment: Overall WFL for tasks assessed    Lower Extremity Assessment Lower Extremity Assessment: LLE deficits/detail LLE Deficits / Details: + SLR with  belt then able to lift to move to sitting, rporta left thigh feels tight    Cervical / Trunk Assessment Cervical / Trunk Assessment: Normal  Communication   Communication Communication: No apparent difficulties    Cognition Arousal: Alert Behavior During Therapy: WFL for tasks assessed/performed   PT -  Cognitive impairments: No apparent impairments                         Following commands: Intact       Cueing       General Comments      Exercises Total Joint Exercises Heel Slides: AAROM, Left, 5 reps Straight Leg Raises: AAROM, Left, 5 reps Long Arc Quad: AROM, Left, 5 reps   Assessment/Plan    PT Assessment Patient needs continued PT services  PT Problem List Decreased strength;Decreased range of motion;Decreased activity tolerance;Decreased mobility;Decreased knowledge of use of DME       PT Treatment Interventions DME instruction;Gait training;Stair training;Functional mobility training;Therapeutic activities;Therapeutic exercise;Patient/family education    PT Goals (Current goals can be found in the Care Plan section)  Acute Rehab PT Goals Patient Stated Goal: go home, back to work PT Goal Formulation: With patient Time For Goal Achievement: 11/29/24 Potential to Achieve Goals: Good    Frequency 7X/week     Co-evaluation               AM-PAC PT 6 Clicks Mobility  Outcome Measure Help needed turning from your back to your side while in a flat bed without using bedrails?: None Help needed moving from lying on your back to sitting on the side of a flat bed without using bedrails?: None Help needed moving to and from a bed to a chair (including a wheelchair)?: A Little Help needed standing up from a chair using your arms (e.g., wheelchair or bedside chair)?: A Little Help needed to walk in hospital room?: A Little Help needed climbing 3-5 steps with a railing? : A Little 6 Click Score: 20    End of Session Equipment Utilized During Treatment: Gait belt Activity Tolerance: Patient tolerated treatment well Patient left: in chair;with call bell/phone within reach;with chair alarm set Nurse Communication: Mobility status PT Visit Diagnosis: Unsteadiness on feet (R26.81);Difficulty in walking, not elsewhere classified (R26.2);Pain Pain -  Right/Left: Left Pain - part of body: Knee    Time: 9056-8981 PT Time Calculation (min) (ACUTE ONLY): 35 min   Charges:   PT Evaluation $PT Eval Low Complexity: 1 Low PT Treatments $Gait Training: 8-22 mins PT General Charges $$ ACUTE PT VISIT: 1 Visit         Darice Potters PT Acute Rehabilitation Services Office [phone]   Potters Darice Norris 11/22/2024, 11:01 AM

## 2024-11-22 NOTE — TOC Transition Note (Signed)
 Transition of Care [facility]) - Discharge Note   Patient Details  Name: Paige Cameron MRN: [id number] Date of Birth: [birth year]  [facility] CM/SW Contact:  NORMAN ASPEN, LCSW Phone Number: 11/22/2024, 2:29 PM   Clinical Narrative:     Met with pt to confirm DME and HH follow up arrangements.  Pt admitted under Worker's Comp and pt case manager is [employer] @ [phone].  Able to speak with Ms. Guillermina who reports needed DME orders sent to [employer] and that she will be picking the items up herself to bring to the patient as soon as they're ready.   She has, also, set up HHPT and awaiting agency acceptance.  Have explained to Ms. Everette that pt will need, at least, the RW urgently and she is trying to make this happen today if at all possible.  Will continue to monitor.  Final next level of care: Home w Home Health Services Barriers to Discharge: No Barriers Identified   Patient Goals and CMS Choice Patient states their goals for this hospitalization and ongoing recovery are:: return home          Discharge Placement                       Discharge Plan and Services Additional resources added to the After Visit Summary for                  DME Arranged: 3-N-1, Walker rolling         HH Arranged: PT          Social Drivers of Health (SDOH) Interventions SDOH Screenings   Food Insecurity: No Food Insecurity (11/21/2024)  Housing: Low Risk (11/21/2024)  Transportation Needs: No Transportation Needs (11/21/2024)  Utilities: Not At Risk (11/21/2024)  Tobacco Use: Low Risk (11/21/2024)     Readmission Risk Interventions     No data to display

## 2024-11-23 ENCOUNTER — Encounter (HOSPITAL_COMMUNITY): Payer: Self-pay | Admitting: Orthopaedic Surgery

## 2024-11-23 DIAGNOSIS — M1712 Unilateral primary osteoarthritis, left knee: Secondary | ICD-10-CM | POA: Diagnosis not present

## 2024-11-23 NOTE — Progress Notes (Signed)
 Physical Therapy Treatment Patient Details Name: Paige Cameron MRN: [id number] DOB: September 10, 1971 Today's Date: 11/23/2024   History of Present Illness 53 yo female s/P  LTKA 11/21/24. PMH: asthma    PT Comments  The patient reports able to sleep  since PT visit.  Patient continues to limited  Left knee ROM and  requiring use of belt to self move. This is considerably different than PT visits on POD 1, noted edema of the LLE. The patient did tolerate ambulation but limited in room this visit.  RW and BSC are still MIA. Patient  does  require RW at DC.  Continue PT until achieves safe mobility  for DC home where she has limited support.       If plan is discharge home, recommend the following: A little help with bathing/dressing/bathroom;Assist for transportation;Help with stairs or ramp for entrance   Can travel by private vehicle        Equipment Recommendations  Rolling walker (2 wheels);BSC/3in1    Recommendations for Other Services       Precautions / Restrictions Precautions Precautions: Knee;Fall Precaution/Restrictions Comments: significant pain left calf and knee Restrictions LLE Weight Bearing Per Provider Order: Weight bearing as tolerated     Mobility  Bed Mobility Overal bed mobility: Needs Assistance Bed Mobility: Sit to Supine     Supine to sit: Modified independent (Device/Increase time) Sit to supine: Contact guard assist   General bed mobility comments: use of belt to move LLE to bed edge moving to right and back onto right side of  bed with belt to slowly lift the leg onto bed with much extra time    Transfers Overall transfer level: Needs assistance Equipment used: Rolling walker (2 wheels) Transfers: Sit to/from Stand Sit to Stand: Contact guard assist           General transfer comment: cues for hand and LLE position,    Ambulation/Gait Ambulation/Gait assistance: Contact guard assist Gait Distance (Feet): 20 Feet (x 2) Assistive  device: Rolling walker (2 wheels) Gait Pattern/deviations: Step-to pattern, Antalgic Gait velocity: decr     General Gait Details: very slow to step,  very slow step to   P[employer]air Mobility     Tilt Bed    Modified Rankin (Stroke Patients Only)       Balance Overall balance assessment: Needs assistance Sitting-balance support: Bilateral upper extremity supported, Feet supported Sitting balance-Leahy Scale: Good     Standing balance support: Reliant on assistive device for balance, During functional activity, Bilateral upper extremity supported Standing balance-Leahy Scale: Fair Standing balance comment: CGA to stand                           Communication    Cognition Arousal: Alert Behavior During Therapy: WFL for tasks assessed/performed, Anxious   PT - Cognitive impairments: No apparent impairments                                Cueing    Exercises      General Comments        Pertinent Vitals/Pain Pain Assessment Pain Score: 8  Faces Pain Scale: Hurts little more Pain Location: left knee and back of knee Pain Descriptors / Indicators: Discomfort, Grimacing, Numbness, Moaning Pain Intervention(s): Repositioned, Patient requesting pain meds-RN notified    Home Living  Prior Function            PT Goals (current goals can now be found in the care plan section) Progress towards PT goals: Progressing toward goals    Frequency    7X/week      PT Plan      Co-evaluation              AM-PAC PT 6 Clicks Mobility   Outcome Measure  Help needed turning from your back to your side while in a flat bed without using bedrails?: A Little Help needed moving from lying on your back to sitting on the side of a flat bed without using bedrails?: A Little Help needed moving to and from a bed to a chair (including a wheelchair)?: A Little Help needed standing up  from a chair using your arms (e.g., wheelchair or bedside chair)?: A Little Help needed to walk in hospital room?: A Little Help needed climbing 3-5 steps with a railing? : A Lot 6 Click Score: 17    End of Session Equipment Utilized During Treatment: Gait belt Activity Tolerance: Patient tolerated treatment well Patient left: in bed;with call bell/phone within reach Nurse Communication: Mobility status;Patient requests pain meds PT Visit Diagnosis: Unsteadiness on feet (R26.81);Difficulty in walking, not elsewhere classified (R26.2);Pain Pain - Right/Left: Left Pain - part of body: Knee     Time: 1500-1526 PT Time Calculation (min) (ACUTE ONLY): 26 min  Charges:    $Gait Training: 8-22 mins $Self Care/Home Management: 8-22 PT General Charges $$ ACUTE PT VISIT: 1 Visit                    Paige Cameron PT Acute Rehabilitation Services Office [phone]    Cameron Paige Norris 11/23/2024, 3:44 PM

## 2024-11-23 NOTE — Progress Notes (Signed)
 Subjective: 2 Days Post-Op Procedures (LRB): ARTHROPLASTY, KNEE, TOTAL (Left)  Patient is doing well. She did not go home yesterday as her workers comp did not get her DME yet. She is hopeful to go home today.  Activity level:  wbat Diet tolerance:  ok Voiding:  ok Patient reports pain as mild.    Objective: Vital signs in last 24 hours: Temp:  [98 F (36.7 C)-99.6 F (37.6 C)] 99.6 F (37.6 C) (12/18 0515) Pulse Rate:  [65-77] 77 (12/18 0515) Resp:  [16-18] 17 (12/18 0515) BP: (102-118)/(60-80) 102/60 (12/18 0515) SpO2:  [97 %-100 %] 97 % (12/18 0515)  Labs: No results for input(s): HGB in the last 72 hours. No results for input(s): WBC, RBC, HCT, PLT in the last 72 hours. No results for input(s): NA, K, CL, CO2, BUN, CREATININE, GLUCOSE, CALCIUM in the last 72 hours. No results for input(s): LABPT, INR in the last 72 hours.  Physical Exam:  Neurologically intact ABD soft Neurovascular intact Sensation intact distally Intact pulses distally Dorsiflexion/Plantar flexion intact Incision: dressing C/D/I and no drainage No cellulitis present Compartment soft  Assessment/Plan:  2 Days Post-Op Procedures (LRB): ARTHROPLASTY, KNEE, TOTAL (Left) Advance diet Up with therapy D/c home today if cleared by PT and if her DME has been delivered. Follow up in office 2 weeks post op. Continue on 81mg  asa BID for DVT prevention     [place]ice [place] Paige Cameron 11/23/2024, 6:45 AM

## 2024-11-23 NOTE — Progress Notes (Signed)
 Physical Therapy Treatment Patient Details Name: Paige Cameron MRN: [id number] DOB: 02-28-1971 Today's Date: 11/23/2024   History of Present Illness 53 yo female s/P  LTKA 11/21/24. PMH: asthma    PT Comments   Patient reporting significant change in LLE pain and discomfort, reports that the left foot feels numb, noted significant edema of the lower leg, lateral  gastroc and knee area.  Patient reports nausea, unable to tolerate  food today.  Patient ambulated very slowly x 20' using RW. Patient demonstrates significant change form Performance in PT x 2 yesterday, infact was to Dc home but DME was not delivered.  Now, question safe  DC today with patient unsteady  and increased pain LE> will se again in PM and assess ability to perform safe mobility for Dc, patient will be home alone most of the time with limited support.    If plan is discharge home, recommend the following: A little help with bathing/dressing/bathroom;Assist for transportation;Help with stairs or ramp for entrance   Can travel by private vehicle        Equipment Recommendations  Rolling walker (2 wheels);BSC/3in1    Recommendations for Other Services       Precautions / Restrictions Precautions Precautions: Knee;Fall Precaution/Restrictions Comments: significant pain left calf Restrictions LLE Weight Bearing Per Provider Order: Weight bearing as tolerated     Mobility  Bed Mobility Overal bed mobility: Needs Assistance Bed Mobility: Sit to Supine       Sit to supine: Contact guard assist   General bed mobility comments: use of belt to lift the leg onto bed with much extra time    Transfers Overall transfer level: Needs assistance Equipment used: Rolling walker (2 wheels) Transfers: Sit to/from Stand Sit to Stand: Contact guard assist           General transfer comment: cues for hand and LLE position,    Ambulation/Gait Ambulation/Gait assistance: Min assist Gait Distance (Feet): 20  Feet Assistive device: Rolling walker (2 wheels) Gait Pattern/deviations: Step-to pattern, Antalgic Gait velocity: decr     General Gait Details: very slow to step, pt reporting L foot numbness and pain in the knee   Stairs             Wheelchair Mobility     Tilt Bed    Modified Rankin (Stroke Patients Only)       Balance Overall balance assessment: Needs assistance Sitting-balance support: Bilateral upper extremity supported, Feet supported Sitting balance-Leahy Scale: Good     Standing balance support: Reliant on assistive device for balance, During functional activity, Bilateral upper extremity supported Standing balance-Leahy Scale: Fair Standing balance comment: patient holding head forward, eyes close, steady support  when stood up, progressed to CGA                            Communication Communication Communication: No apparent difficulties  Cognition Arousal: Alert Behavior During Therapy: WFL for tasks assessed/performed, Anxious   PT - Cognitive impairments: No apparent impairments                                Cueing    Exercises      General Comments        Pertinent Vitals/Pain Pain Assessment Pain Score: 8  Pain Location: left knee and back of knee Pain Descriptors / Indicators: Discomfort, Grimacing, Numbness, Moaning Pain Intervention(s): Limited activity within  patient's tolerance, Monitored during session, Premedicated before session, Ice applied, Repositioned    Home Living                          Prior Function            PT Goals (current goals can now be found in the care plan section) Progress towards PT goals: Not progressing toward goals - comment    Frequency    7X/week      PT Plan      Co-evaluation              AM-PAC PT 6 Clicks Mobility   Outcome Measure  Help needed turning from your back to your side while in a flat bed without using bedrails?: A  Little Help needed moving from lying on your back to sitting on the side of a flat bed without using bedrails?: A Little Help needed moving to and from a bed to a chair (including a wheelchair)?: A Little Help needed standing up from a chair using your arms (e.g., wheelchair or bedside chair)?: A Lot Help needed to walk in hospital room?: A Lot Help needed climbing 3-5 steps with a railing? : A Lot 6 Click Score: 15    End of Session Equipment Utilized During Treatment: Gait belt Activity Tolerance: Patient limited by pain Patient left: in bed;with call bell/phone within reach;with bed alarm set Nurse Communication: Mobility status (significant change  in paina nd mobility from PT yesterday) PT Visit Diagnosis: Unsteadiness on feet (R26.81);Difficulty in walking, not elsewhere classified (R26.2);Pain Pain - Right/Left: Left Pain - part of body: Knee     Time: 8892-8866 PT Time Calculation (min) (ACUTE ONLY): 26 min  Charges:    $Gait Training: 8-22 mins $Therapeutic Activity: 8-22 mins PT General Charges $$ ACUTE PT VISIT: 1 Visit                     Darice Potters PT Acute Rehabilitation Services Office [phone]   Potters Darice Norris 11/23/2024, 11:42 AM

## 2024-11-23 NOTE — Plan of Care (Signed)
°  Problem: Education: Goal: Knowledge of General Education information will improve Description: Including pain rating scale, medication(s)/side effects and non-pharmacologic comfort measures Outcome: Adequate for Discharge   Problem: Health Behavior/Discharge Planning: Goal: Ability to manage health-related needs will improve Outcome: Adequate for Discharge   Problem: Clinical Measurements: Goal: Ability to maintain clinical measurements within normal limits will improve Outcome: Progressing Goal: Will remain free from infection Outcome: Progressing Goal: Diagnostic test results will improve Outcome: Progressing Goal: Respiratory complications will improve Outcome: Progressing Goal: Cardiovascular complication will be avoided Outcome: Progressing   Problem: Activity: Goal: Risk for activity intolerance will decrease Outcome: Adequate for Discharge   Problem: Nutrition: Goal: Adequate nutrition will be maintained Outcome: Completed/Met   Problem: Coping: Goal: Level of anxiety will decrease Outcome: Progressing   Problem: Elimination: Goal: Will not experience complications related to bowel motility Outcome: Progressing Goal: Will not experience complications related to urinary retention Outcome: Completed/Met   Problem: Pain Managment: Goal: General experience of comfort will improve and/or be controlled Outcome: Adequate for Discharge   Problem: Safety: Goal: Ability to remain free from injury will improve Outcome: Adequate for Discharge   Problem: Skin Integrity: Goal: Risk for impaired skin integrity will decrease Outcome: Adequate for Discharge   Problem: Education: Goal: Knowledge of the prescribed therapeutic regimen will improve Outcome: Adequate for Discharge Goal: Individualized Educational Video(s) Outcome: Completed/Met   Problem: Activity: Goal: Ability to avoid complications of mobility impairment will improve Outcome: Progressing Goal: Range  of joint motion will improve Outcome: Adequate for Discharge   Problem: Clinical Measurements: Goal: Postoperative complications will be avoided or minimized Outcome: Progressing   Problem: Pain Management: Goal: Pain level will decrease with appropriate interventions Outcome: Progressing   Problem: Skin Integrity: Goal: Will show signs of wound healing Outcome: Progressing

## 2024-11-24 ENCOUNTER — Other Ambulatory Visit (HOSPITAL_COMMUNITY): Payer: Self-pay

## 2024-11-24 DIAGNOSIS — M1712 Unilateral primary osteoarthritis, left knee: Secondary | ICD-10-CM | POA: Diagnosis not present

## 2024-11-24 MED ORDER — PROMETHAZINE HCL 12.5 MG PO TABS
ORAL_TABLET | ORAL | 0 refills | Status: AC
  Filled 2024-11-24: qty 20, 5d supply, fill #0

## 2024-11-24 NOTE — Progress Notes (Signed)
 Discharge medication delivered to patient at the bedside

## 2024-11-24 NOTE — Plan of Care (Signed)
   Problem: Clinical Measurements: Goal: Ability to maintain clinical measurements within normal limits will improve Outcome: Progressing Goal: Will remain free from infection Outcome: Progressing Goal: Diagnostic test results will improve Outcome: Progressing Goal: Respiratory complications will improve Outcome: Progressing

## 2024-11-24 NOTE — Progress Notes (Signed)
 Physical Therapy Treatment Patient Details Name: Paige Cameron MRN: [id number] DOB: [birth year] Today's Date: 11/24/2024   History of Present Illness 53 yo female s/P  LTKA 11/21/24. PMH: asthma    PT Comments  Pt is POD # 3 and is progressing well.  She is ambulating 65' and performed step similar to home set up.  Overall - improved mobility and safe with transfers and mobility.  Pt has good quad activation and extension.  Flexion ROM limited by pain.  Noting plan is for HHPT at d/c per ortho notes. Pt demonstrates safe gait & transfers in order to return home from PT perspective once discharged by MD.  While in hospital, will continue to benefit from PT for skilled therapy to advance mobility and exercises.       If plan is discharge home, recommend the following: A little help with bathing/dressing/bathroom;Assist for transportation;Help with stairs or ramp for entrance;Assistance with cooking/housework   Can travel by private vehicle        Equipment Recommendations  Rolling walker (2 wheels);BSC/3in1    Recommendations for Other Services       Precautions / Restrictions Precautions Precautions: Knee;Fall Restrictions LLE Weight Bearing Per Provider Order: Weight bearing as tolerated     Mobility  Bed Mobility Overal bed mobility: Needs Assistance Bed Mobility: Supine to Sit     Supine to sit: HOB elevated, Used rails, Supervision Sit to supine: Supervision, HOB elevated, Used rails   General bed mobility comments: HOB elevated and significant dependence on rail andusing gait belt for L LE. Pt has taller air mattress at home on first floor but also has recliner.  Discussed may need to use recliner initially.    Transfers Overall transfer level: Needs assistance Equipment used: Rolling walker (2 wheels) Transfers: Sit to/from Stand Sit to Stand: Supervision           General transfer comment: Cues to extend L LE to return to sitting; STS x 2     Ambulation/Gait Ambulation/Gait assistance: Supervision Gait Distance (Feet): 80 Feet Assistive device: Rolling walker (2 wheels) Gait Pattern/deviations: Step-to pattern, Antalgic, Decreased weight shift to left Gait velocity: functional     General Gait Details: Steady gait but with stiff L LE   Stairs Stairs: Yes Stairs assistance: Contact guard assist Stair Management: Step to pattern, Backwards, With walker   General stair comments: performed 1 six inch platform step to simulate threshold.  Unable to do forward, tolerated well backward   Wheelchair Mobility     Tilt Bed    Modified Rankin (Stroke Patients Only)       Balance Overall balance assessment: Needs assistance Sitting-balance support: Feet supported, No upper extremity supported Sitting balance-Leahy Scale: Good     Standing balance support: Bilateral upper extremity supported, No upper extremity supported Standing balance-Leahy Scale: Fair Standing balance comment: RW for gait; Could static stand wtihout support                            Communication    Cognition Arousal: Alert Behavior During Therapy: WFL for tasks assessed/performed   PT - Cognitive impairments: No apparent impairments                                Cueing    Exercises Total Joint Exercises Ankle Circles/Pumps: AROM, Both, 10 reps, Supine Quad Sets: AROM, Both, 10 reps, Supine Heel Slides:  AAROM, Left, 5 reps, Supine Hip ABduction/ADduction: AAROM, Left, 5 reps, Supine Long Arc Quad: AAROM, Left, 10 reps, Seated Knee Flexion: AAROM, Left, 10 reps, Seated Goniometric ROM: Pt wtih very stiff/limited flexion to only 15-20 degrees with manual assist from PT and was painful. Full extension at 0 degrees.  Did get message to PA about possible CPM if desired.    General Comments  Educated on safe ice use, no pivots, car transfers, resting with leg straight, and TED hose during day. Also, encouraged  walking every 1-2 hours during day. Educated on HEP with focus on mobility the first weeks. Discussed doing exercises within pain control and if pain increasing could decreased ROM, reps, and stop exercises as needed. Encouraged to perform quad sets and ankle pumps frequently for blood flow and to promote full knee extension.       Pertinent Vitals/Pain Pain Assessment Pain Assessment: 0-10 Pain Score: 3  Pain Location: left knee Pain Descriptors / Indicators: Sore, Tingling Pain Intervention(s): Limited activity within patient's tolerance, Monitored during session, Premedicated before session, Repositioned, Ice applied    Home Living                          Prior Function            PT Goals (current goals can now be found in the care plan section) Progress towards PT goals: Progressing toward goals    Frequency    7X/week      PT Plan      Co-evaluation              AM-PAC PT 6 Clicks Mobility   Outcome Measure  Help needed turning from your back to your side while in a flat bed without using bedrails?: A Little Help needed moving from lying on your back to sitting on the side of a flat bed without using bedrails?: A Little Help needed moving to and from a bed to a chair (including a wheelchair)?: A Little Help needed standing up from a chair using your arms (e.g., wheelchair or bedside chair)?: A Little Help needed to walk in hospital room?: A Little Help needed climbing 3-5 steps with a railing? : A Little 6 Click Score: 18    End of Session Equipment Utilized During Treatment: Gait belt Activity Tolerance: Patient tolerated treatment well Patient left: with call bell/phone within reach;in chair Nurse Communication: Mobility status PT Visit Diagnosis: Unsteadiness on feet (R26.81);Difficulty in walking, not elsewhere classified (R26.2);Pain Pain - Right/Left: Left Pain - part of body: Knee     Time: 8597-8555 PT Time Calculation (min)  (ACUTE ONLY): 42 min  Charges:    $Gait Training: 8-22 mins $Therapeutic Exercise: 8-22 mins $Therapeutic Activity: 8-22 mins PT General Charges $$ ACUTE PT VISIT: 1 Visit                     Benjiman, PT Acute Rehab [facility] [phone]    Benjiman VEAR Mulberry 11/24/2024, 2:49 PM

## 2024-11-24 NOTE — Progress Notes (Signed)
 Subjective: 3 Days Post-Op Procedures (LRB): ARTHROPLASTY, KNEE, TOTAL (Left)  Patient doing well. She is hoping to go home today but is still waiting oin her DME from workers comp.  Activity level:  wbat Diet tolerance:  ok Voiding:  ok Patient reports pain as mild.    Objective: Vital signs in last 24 hours: Temp:  [98 F (36.7 C)-98.6 F (37 C)] 98.6 F (37 C) (12/19 0552) Pulse Rate:  [72-77] 77 (12/19 0552) Resp:  [16-17] 17 (12/19 0552) BP: (110-131)/(59-72) 131/72 (12/19 0552) SpO2:  [98 %-99 %] 98 % (12/19 0552)  Labs: No results for input(s): HGB in the last 72 hours. No results for input(s): WBC, RBC, HCT, PLT in the last 72 hours. No results for input(s): NA, K, CL, CO2, BUN, CREATININE, GLUCOSE, CALCIUM in the last 72 hours. No results for input(s): LABPT, INR in the last 72 hours.  Physical Exam:  Neurologically intact ABD soft Neurovascular intact Sensation intact distally Intact pulses distally Dorsiflexion/Plantar flexion intact Incision: dressing C/D/I and no drainage No cellulitis present Compartment soft  Assessment/Plan:  3 Days Post-Op Procedures (LRB): ARTHROPLASTY, KNEE, TOTAL (Left) Advance diet Up with therapy Hopefully discharge today if cleared by PT and if her DME has been delivered. Follow up in office 2 weeks post op. Continue on 81mg  asa BID for DVT prevention.   Prentice Mt Traveon Louro 11/24/2024, 7:52 AM

## 2024-11-24 NOTE — Progress Notes (Addendum)
 Physical Therapy Treatment Patient Details Name: Paige Cameron MRN: [id number] DOB: Jul 09, 1971 Today's Date: 11/24/2024   History of Present Illness 53 yo female s/P  LTKA 11/21/24. PMH: asthma    PT Comments  Pt reports improved pain today.  She was able to ambulate 37' with RW and supervision.  Need to try stairs this afternoon.  Does have very limited knee flexion due to pain and stiffness.      If plan is discharge home, recommend the following: A little help with bathing/dressing/bathroom;Assist for transportation;Help with stairs or ramp for entrance;Assistance with cooking/housework   Can travel by private vehicle        Equipment Recommendations  Rolling walker (2 wheels);BSC/3in1    Recommendations for Other Services       Precautions / Restrictions Precautions Precautions: Knee;Fall Restrictions LLE Weight Bearing Per Provider Order: Weight bearing as tolerated     Mobility  Bed Mobility Overal bed mobility: Needs Assistance Bed Mobility: Supine to Sit     Supine to sit: HOB elevated, Used rails, Supervision     General bed mobility comments: HOB elevated and significant dependence on rail andusing gait belt for L LE. Pt has taller air mattress at home on first floor but also has recliner.  Discussed may need to use recliner initially.    Transfers Overall transfer level: Needs assistance Equipment used: Rolling walker (2 wheels) Transfers: Sit to/from Stand Sit to Stand: Supervision           General transfer comment: Cues to extend L LE to return to sitting    Ambulation/Gait Ambulation/Gait assistance: Contact guard assist, Supervision Gait Distance (Feet): 70 Feet Assistive device: Rolling walker (2 wheels) Gait Pattern/deviations: Step-to pattern, Antalgic, Decreased weight shift to left Gait velocity: functional     General Gait Details: Steady gait but with stiff L LE   Stairs             Wheelchair Mobility     Tilt  Bed    Modified Rankin (Stroke Patients Only)       Balance Overall balance assessment: Needs assistance Sitting-balance support: Feet supported, No upper extremity supported Sitting balance-Leahy Scale: Good     Standing balance support: Bilateral upper extremity supported, No upper extremity supported Standing balance-Leahy Scale: Fair Standing balance comment: RW for gait; Could static stand wtihout support                            Communication    Cognition Arousal: Alert Behavior During Therapy: WFL for tasks assessed/performed   PT - Cognitive impairments: No apparent impairments                                Cueing    Exercises Total Joint Exercises Ankle Circles/Pumps: AROM, Both, 10 reps, Supine Quad Sets: AROM, Both, 10 reps, Supine Long Arc Quad: AAROM, Left, 10 reps, Seated Knee Flexion: AAROM, Left, 10 reps, Seated Goniometric ROM: Pt wtih very stiff/limited flexion to only 15-20 degrees with manual assist from PT and was painful. Full extension at 0 degrees.  Did get message to PA about possible CPM if desired.    General Comments        Pertinent Vitals/Pain Pain Assessment Pain Assessment: 0-10 Pain Score: 3  Pain Location: left knee Pain Descriptors / Indicators: Discomfort, Grimacing, Guarding, Tingling Pain Intervention(s): Limited activity within patient's tolerance, Monitored during session,  Premedicated before session    Home Living                          Prior Function            PT Goals (current goals can now be found in the care plan section) Progress towards PT goals: Progressing toward goals    Frequency    7X/week      PT Plan      Co-evaluation              AM-PAC PT 6 Clicks Mobility   Outcome Measure  Help needed turning from your back to your side while in a flat bed without using bedrails?: A Little Help needed moving from lying on your back to sitting on the side  of a flat bed without using bedrails?: A Little Help needed moving to and from a bed to a chair (including a wheelchair)?: A Little Help needed standing up from a chair using your arms (e.g., wheelchair or bedside chair)?: A Little Help needed to walk in hospital room?: A Little Help needed climbing 3-5 steps with a railing? : A Lot 6 Click Score: 17    End of Session Equipment Utilized During Treatment: Gait belt Activity Tolerance: Patient tolerated treatment well Patient left: with call bell/phone within reach;in chair Nurse Communication: Mobility status PT Visit Diagnosis: Unsteadiness on feet (R26.81);Difficulty in walking, not elsewhere classified (R26.2);Pain Pain - Right/Left: Left Pain - part of body: Knee     Time: 8954-8884 PT Time Calculation (min) (ACUTE ONLY): 30 min  Charges:    $Gait Training: 8-22 mins $Therapeutic Exercise: 8-22 mins PT General Charges $$ ACUTE PT VISIT: 1 Visit                     Benjiman, PT Acute Rehab Services [facility] [phone]    Benjiman VEAR Mulberry 11/24/2024, 12:04 PM
